# Patient Record
Sex: Male | Born: 1985 | Race: Black or African American | Hispanic: No | Marital: Single | Smoking: Current some day smoker
Health system: Southern US, Community
[De-identification: ages and names within clinical notes are randomized; demographics above are authoritative.]

## PROBLEM LIST (undated history)

## (undated) DIAGNOSIS — M542 Cervicalgia: Secondary | ICD-10-CM

## (undated) DIAGNOSIS — M5412 Radiculopathy, cervical region: Secondary | ICD-10-CM

## (undated) DIAGNOSIS — K469 Unspecified abdominal hernia without obstruction or gangrene: Secondary | ICD-10-CM

## (undated) DIAGNOSIS — Z8739 Personal history of other diseases of the musculoskeletal system and connective tissue: Secondary | ICD-10-CM

## (undated) DIAGNOSIS — G8929 Other chronic pain: Secondary | ICD-10-CM

## (undated) HISTORY — PX: HERNIA REPAIR: SHX51

## (undated) HISTORY — PX: KNEE SURGERY: SHX244

---
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
## 2012-03-22 ENCOUNTER — Emergency Department (HOSPITAL_COMMUNITY)
Admission: EM | Admit: 2012-03-22 | Discharge: 2012-03-22 | Disposition: A | Discharge status: Home / Self Care | Payer: Self-pay | Attending: Emergency Medicine | Admitting: Emergency Medicine

## 2012-03-22 ENCOUNTER — Encounter (HOSPITAL_COMMUNITY): Payer: Self-pay | Admitting: Emergency Medicine

## 2012-03-22 DIAGNOSIS — K458 Other specified abdominal hernia without obstruction or gangrene: Secondary | ICD-10-CM | POA: Insufficient documentation

## 2012-03-22 DIAGNOSIS — K4090 Unilateral inguinal hernia, without obstruction or gangrene, not specified as recurrent: Secondary | ICD-10-CM

## 2012-03-22 DIAGNOSIS — F17200 Nicotine dependence, unspecified, uncomplicated: Secondary | ICD-10-CM | POA: Insufficient documentation

## 2012-03-22 NOTE — ED Notes (Signed)
Pt c/o pain in LLQ of abd and groin for several weeks, pt states "I think I have a hernia". Pt states he does a lot of lifting at work.

## 2012-03-22 NOTE — ED Provider Notes (Signed)
History     CSN: [id number]  Arrival date & time 03/22/12  0904   First MD Initiated Contact with Patient 03/22/12 [phone]      Chief Complaint  Patient presents with  . Groin Pain    (Consider location/radiation/quality/duration/timing/severity/associated sxs/prior treatment) HPI Comments: Alex Vasquez is a 26 y.o. Male who has noticed swelling in his left groin for several weeks. The problem is more prominent when standing or lifting. He has mild associated pain. No history of vomiting, diarrhea, fever, chills. No similar in the past. The other modifying factors  The history is provided by the patient.    History reviewed. No pertinent past medical history.  Past Surgical History  Procedure Date  . Knee surgery     History reviewed. No pertinent family history.  History  Substance Use Topics  . Smoking status: Current Every Day Smoker  . Smokeless tobacco: Not on file  . Alcohol Use: Yes      Review of Systems  All other systems reviewed and are negative.    Allergies  Review of patient's allergies indicates no known allergies.  Home Medications   Current Outpatient Rx  Name  Route  Sig  Dispense  Refill  . IBUPROFEN 200 MG PO TABS   Oral   Take 400 mg by mouth every 6 (six) hours as needed. Pain           BP 147/92  Pulse 90  Temp 97.6 F (36.4 C) (Oral)  Resp 16  SpO2 98%  Physical Exam  Nursing note and vitals reviewed. Constitutional: He is oriented to person, place, and time. He appears well-developed and well-nourished.  HENT:  Head: Normocephalic and atraumatic.  Right Ear: External ear normal.  Left Ear: External ear normal.  Eyes: Conjunctivae normal and EOM are normal. Pupils are equal, round, and reactive to light.  Neck: Normal range of motion and phonation normal. Neck supple.  Cardiovascular: Normal rate.   Pulmonary/Chest: Effort normal. He exhibits no bony tenderness.  Abdominal: Soft. Normal appearance. There is no  tenderness.       Left groin hernia, 3 cm palpated passed through the scrotum. There is a bulge with Valsalva, and immediately reduces.  Musculoskeletal: Normal range of motion.  Neurological: He is alert and oriented to person, place, and time. He has normal strength. No cranial nerve deficit or sensory deficit. He exhibits normal muscle tone. Coordination normal.  Skin: Skin is warm, dry and intact.  Psychiatric: He has a normal mood and affect. His behavior is normal. Judgment and thought content normal.    ED Course  Procedures (including critical care time)      1. Left groin hernia       MDM  Left groin hernia, reducible. Doubt incarceration or obstruction. He stable for discharge with outpatient management.   Plan: Home Medications- Tylenol; Home Treatments- avoid lifting; Recommended follow up- Gen. Surg. prn     Flint Melter, MD 03/22/12 1501

## 2012-03-27 ENCOUNTER — Ambulatory Visit (INDEPENDENT_AMBULATORY_CARE_PROVIDER_SITE_OTHER): Payer: Self-pay | Admitting: General Surgery

## 2012-04-03 ENCOUNTER — Ambulatory Visit (INDEPENDENT_AMBULATORY_CARE_PROVIDER_SITE_OTHER): Payer: Self-pay | Admitting: General Surgery

## 2012-04-13 ENCOUNTER — Encounter (HOSPITAL_COMMUNITY): Payer: Self-pay | Admitting: Emergency Medicine

## 2012-04-13 ENCOUNTER — Emergency Department (HOSPITAL_COMMUNITY)
Admission: EM | Admit: 2012-04-13 | Discharge: 2012-04-13 | Disposition: A | Discharge status: Home / Self Care | Payer: Self-pay | Attending: Emergency Medicine | Admitting: Emergency Medicine

## 2012-04-13 DIAGNOSIS — K469 Unspecified abdominal hernia without obstruction or gangrene: Secondary | ICD-10-CM | POA: Insufficient documentation

## 2012-04-13 DIAGNOSIS — F17200 Nicotine dependence, unspecified, uncomplicated: Secondary | ICD-10-CM | POA: Insufficient documentation

## 2012-04-13 HISTORY — DX: Unspecified abdominal hernia without obstruction or gangrene: K46.9

## 2012-04-13 NOTE — ED Notes (Signed)
Pt presenting to ed with c/o I was seen here before Christmas for a hernia pt states he received a work note at that time but he hasn't been back to work and "I would like to see if I can get an extension" pt states he hasn't followed up with the surgery doctor.

## 2012-04-13 NOTE — ED Provider Notes (Signed)
History     CSN: [id number]  Arrival date & time 04/13/12  1050   First MD Initiated Contact with Patient 04/13/12 1109      Chief Complaint  Patient presents with  . work note    (Consider location/radiation/quality/duration/timing/severity/associated sxs/prior treatment) HPI The patient presents to the emergency department with a request for a work note to return to work.  Patient states he is here on December 19 and was diagnosed with a hernia and would like to go back to work.  Patient states that he called the surgeon about an appointment, but does not have the money to pay for it at this time.  Patient denies fever, nausea, vomiting, abdominal pain, back pain, headache, weakness, or diarrhea.  Patient states that he has not not had any other competitions from this hernia.    t Past Medical History  Diagnosis Date  . Hernia     Past Surgical History  Procedure Date  . Knee surgery     No family history on file.  History  Substance Use Topics  . Smoking status: Current Some Day Smoker  . Smokeless tobacco: Not on file  . Alcohol Use: Yes     Comment: rarely      Review of Systems All other systems negative except as documented in the HPI. All pertinent positives and negatives as reviewed in the HPI. Allergies  Review of patient's allergies indicates no known allergies.  Home Medications   Current Outpatient Rx  Name  Route  Sig  Dispense  Refill  . IBUPROFEN 200 MG PO TABS   Oral   Take 400 mg by mouth every 6 (six) hours as needed. Pain           BP 138/93  Pulse 91  Temp 98.3 F (36.8 C) (Oral)  Resp 20  SpO2 98%  Physical Exam  Nursing note and vitals reviewed. Constitutional: He is oriented to person, place, and time. He appears well-developed and well-nourished. No distress.  Eyes: Pupils are equal, round, and reactive to light.  Pulmonary/Chest: Effort normal.  Abdominal: Soft. Bowel sounds are normal. He exhibits no distension. There  is no tenderness.  Neurological: He is alert and oriented to person, place, and time.    ED Course  Procedures (including critical care time)  We will give the patient.  A note to return to work and is advised, that he'll need to see the surgeon, as soon as possible.  Return here for any worsening in his condition MDM         Carlyle Dolly, PA-C 04/13/12 1126

## 2012-04-13 NOTE — ED Provider Notes (Signed)
Medical screening examination/treatment/procedure(s) were performed by non-physician practitioner and as supervising physician I was immediately available for consultation/collaboration.   Alferd Obryant, MD 04/13/12 1220 

## 2012-04-13 NOTE — ED Notes (Signed)
Pt reports he has hernia pain that he was seen for last month. Sts he was given a work note stating that he could return to work and lift less than 20lbs and is requesting "an extension" on this in order to return to work until his insurance comes through to help pay for his hernia surgery. Pt reports he is still having pain but main concern is a work note.

## 2012-06-04 ENCOUNTER — Emergency Department (HOSPITAL_COMMUNITY): Payer: Self-pay

## 2012-06-04 ENCOUNTER — Emergency Department (HOSPITAL_COMMUNITY)
Admission: EM | Admit: 2012-06-04 | Discharge: 2012-06-04 | Disposition: A | Discharge status: Home / Self Care | Payer: Self-pay | Attending: Emergency Medicine | Admitting: Emergency Medicine

## 2012-06-04 ENCOUNTER — Encounter (HOSPITAL_COMMUNITY): Payer: Self-pay | Admitting: Emergency Medicine

## 2012-06-04 DIAGNOSIS — F17200 Nicotine dependence, unspecified, uncomplicated: Secondary | ICD-10-CM | POA: Insufficient documentation

## 2012-06-04 DIAGNOSIS — K5900 Constipation, unspecified: Secondary | ICD-10-CM | POA: Insufficient documentation

## 2012-06-04 DIAGNOSIS — R300 Dysuria: Secondary | ICD-10-CM | POA: Insufficient documentation

## 2012-06-04 DIAGNOSIS — Z8719 Personal history of other diseases of the digestive system: Secondary | ICD-10-CM | POA: Insufficient documentation

## 2012-06-04 DIAGNOSIS — R110 Nausea: Secondary | ICD-10-CM | POA: Insufficient documentation

## 2012-06-04 DIAGNOSIS — R42 Dizziness and giddiness: Secondary | ICD-10-CM | POA: Insufficient documentation

## 2012-06-04 DIAGNOSIS — K4090 Unilateral inguinal hernia, without obstruction or gangrene, not specified as recurrent: Secondary | ICD-10-CM | POA: Insufficient documentation

## 2012-06-04 LAB — URINALYSIS, ROUTINE W REFLEX MICROSCOPIC
Glucose, UA: NEGATIVE mg/dL
Hgb urine dipstick: NEGATIVE
Ketones, ur: NEGATIVE mg/dL
Leukocytes, UA: NEGATIVE
Protein, ur: NEGATIVE mg/dL
pH: 7 (ref 5.0–8.0)

## 2012-06-04 MED ORDER — LIDOCAINE HCL (CARDIAC) 20 MG/ML IV SOLN
INTRAVENOUS | Status: AC
  Filled 2012-06-04: qty 5

## 2012-06-04 MED ORDER — ETOMIDATE 2 MG/ML IV SOLN
INTRAVENOUS | Status: AC
  Filled 2012-06-04: qty 20

## 2012-06-04 MED ORDER — SUCCINYLCHOLINE CHLORIDE 20 MG/ML IJ SOLN
INTRAMUSCULAR | Status: AC
  Filled 2012-06-04: qty 1

## 2012-06-04 MED ORDER — ONDANSETRON 4 MG PO TBDP
8.0000 mg | ORAL_TABLET | Freq: Once | ORAL | Status: AC
  Administered 2012-06-04: 8 mg via ORAL
  Filled 2012-06-04: qty 2

## 2012-06-04 MED ORDER — ROCURONIUM BROMIDE 50 MG/5ML IV SOLN
INTRAVENOUS | Status: AC
  Filled 2012-06-04: qty 2

## 2012-06-04 MED ORDER — IBUPROFEN 600 MG PO TABS: 600.0000 mg | ORAL_TABLET | Freq: Four times a day (QID) | ORAL | Status: DC | PRN

## 2012-06-04 MED ORDER — IBUPROFEN 400 MG PO TABS
600.0000 mg | ORAL_TABLET | Freq: Once | ORAL | Status: AC
  Administered 2012-06-04: 600 mg via ORAL
  Filled 2012-06-04: qty 1

## 2012-06-04 MED ORDER — PROMETHAZINE HCL 25 MG RE SUPP: 25.0000 mg | Freq: Four times a day (QID) | RECTAL | Status: DC | PRN

## 2012-06-04 MED ORDER — HYDROCODONE-ACETAMINOPHEN 5-325 MG PO TABS: 1.0000 | ORAL_TABLET | Freq: Four times a day (QID) | ORAL | Status: DC | PRN

## 2012-06-04 MED ORDER — HYDROCODONE-ACETAMINOPHEN 5-325 MG PO TABS
1.0000 | ORAL_TABLET | Freq: Once | ORAL | Status: AC
  Administered 2012-06-04: 1 via ORAL
  Filled 2012-06-04: qty 1

## 2012-06-04 NOTE — ED Notes (Signed)
Onset 1 week ago dysuria and urinary frequency with constipation one day ago. Abdominal pain LLQ and RLQ with nausea pain 8/10 achy cramping.

## 2012-06-04 NOTE — ED Provider Notes (Signed)
History     CSN: [id number]  Arrival date & time 06/04/12  4098   First MD Initiated Contact with Patient 06/04/12 [phone]      Chief Complaint  Patient presents with  . Dysuria  . Abdominal Pain    (Consider location/radiation/quality/duration/timing/severity/associated sxs/prior treatment) HPI Comments: Pt comes in with cc of inguinal hernia pain. Pt has known inguinal hernia, left sided. Over the past few days, his pain has progressed slightly, and is more constant. There is some nausea, no emesis. Pt also feels constipated, last BM was yday. He is taking no pain meds at this time, and there is no fevers, chills. Pt has not seen a surgeon yet, as his insurance status is pending. Pt also feels that he is having slight discomfort occasionally with urination.  Patient is a 27 y.o. male presenting with dysuria and abdominal pain. The history is provided by the patient.  Dysuria  Associated symptoms include nausea. Pertinent negatives include no chills, no vomiting and no flank pain.  Abdominal Pain Associated symptoms: constipation and nausea   Associated symptoms: no chest pain, no chills, no cough, no fever, no shortness of breath and no vomiting     Past Medical History  Diagnosis Date  . Hernia     Past Surgical History  Procedure Laterality Date  . Knee surgery      No family history on file.  History  Substance Use Topics  . Smoking status: Current Some Day Smoker  . Smokeless tobacco: Not on file  . Alcohol Use: Yes     Comment: rarely      Review of Systems  Constitutional: Negative for fever, chills and activity change.  HENT: Negative for neck pain.   Eyes: Negative for visual disturbance.  Respiratory: Negative for cough, chest tightness and shortness of breath.   Cardiovascular: Negative for chest pain.  Gastrointestinal: Positive for nausea, abdominal pain and constipation. Negative for vomiting and abdominal distention.  Genitourinary: Negative for  flank pain, scrotal swelling, enuresis, difficulty urinating and testicular pain.  Musculoskeletal: Negative for arthralgias.  Neurological: Positive for dizziness. Negative for light-headedness and headaches.  Psychiatric/Behavioral: Negative for confusion.    Allergies  Review of patient's allergies indicates no known allergies.  Home Medications   Current Outpatient Rx  Name  Route  Sig  Dispense  Refill  . HYDROcodone-acetaminophen (NORCO/VICODIN) 5-325 MG per tablet   Oral   Take 1 tablet by mouth every 6 (six) hours as needed for pain.   15 tablet   0   . ibuprofen (ADVIL,MOTRIN) 600 MG tablet   Oral   Take 1 tablet (600 mg total) by mouth every 6 (six) hours as needed for pain.   30 tablet   0   . promethazine (PHENERGAN) 25 MG suppository   Rectal   Place 1 suppository (25 mg total) rectally every 6 (six) hours as needed for nausea.   12 each   0     BP 146/93  Pulse 80  Temp(Src) 97.5 F (36.4 C) (Oral)  Resp 16  Ht 6' (1.829 m)  Wt 245 lb (111.131 kg)  BMI 33.22 kg/m2  SpO2 100%  Physical Exam  Nursing note and vitals reviewed. Constitutional: He is oriented to person, place, and time. He appears well-developed.  HENT:  Head: Normocephalic and atraumatic.  Eyes: Conjunctivae and EOM are normal. Pupils are equal, round, and reactive to light.  Neck: Normal range of motion. Neck supple.  Cardiovascular: Normal rate and regular rhythm.  Pulmonary/Chest: Effort normal and breath sounds normal.  Abdominal: Soft. Bowel sounds are normal. He exhibits no distension. There is no tenderness. There is no rebound and no guarding.  Genitourinary: Penis normal.  Left sided inguinal hernia - able to reduce easily. Inguinal hernia verified via GU exam as well. No skin changes over lying the hernia.  Neurological: He is alert and oriented to person, place, and time.  Skin: Skin is warm.    ED Course  Procedures (including critical care time)  Labs Reviewed   URINALYSIS, ROUTINE W REFLEX MICROSCOPIC   Dg Abd Acute W/chest  06/04/2012  *RADIOLOGY REPORT*  Clinical Data: Abdominal pain with constipation.  Rule out bowel obstruction  ACUTE ABDOMEN SERIES (ABDOMEN 2 VIEW & CHEST 1 VIEW)  Comparison: None.  Findings: Lungs are clear without infiltrate or effusion.  Heart size is normal.  Mild amount of stool in the right and transverse colon.  No dilated bowel loops and no free air.  No acute bony abnormality.  No kidney stones.  IMPRESSION: No acute cardiopulmonary abnormality.  Retained stool in the colon without bowel obstruction.   Original Report Authenticated By: Janeece Riggers, M.D.      1. Inguinal hernia       MDM  Pt comes in cc og inguinal pain. No incarcerated hernia per exam. AAS shows no obstruction either. Will give pain meds. [facility] follow up, and we discussed need to return to the ED if the symptoms gets worse.   Derwood Kaplan, MD 06/04/12 1158

## 2012-06-08 ENCOUNTER — Ambulatory Visit (INDEPENDENT_AMBULATORY_CARE_PROVIDER_SITE_OTHER): Payer: Self-pay | Admitting: Surgery

## 2012-06-11 ENCOUNTER — Ambulatory Visit (INDEPENDENT_AMBULATORY_CARE_PROVIDER_SITE_OTHER): Payer: Self-pay | Admitting: Surgery

## 2012-06-11 ENCOUNTER — Encounter (INDEPENDENT_AMBULATORY_CARE_PROVIDER_SITE_OTHER): Payer: Self-pay | Admitting: Surgery

## 2012-06-11 VITALS — BP 128/86 | HR 76 | Temp 97.2°F | Resp 16 | Ht 72.0 in | Wt 251.0 lb

## 2012-06-11 DIAGNOSIS — K4090 Unilateral inguinal hernia, without obstruction or gangrene, not specified as recurrent: Secondary | ICD-10-CM

## 2012-06-11 NOTE — Patient Instructions (Signed)
Hernia Repair Care After These instructions give you information on caring for yourself after your procedure. Your doctor may also give you more specific instructions. Call your doctor if you have any problems or questions after your procedure. HOME CARE   You may have changes in your poops (bowel movements).  You may have loose or watery poop (diarrhea).  You may be not able to poop.  Your bowels will slowly get back to normal.  Do not eat any food that makes you sick to your stomach (nauseous). Eat small meals 4 to 6 times a day instead of 3 large ones.  Do not drink pop. It will give you gas.  Do not drink alcohol.  Do not lift anything heavier than 10 pounds. This is about the weight of a gallon of milk.  Do not do anything that makes you very tired for at least 6 weeks.  Do not get your wound wet for 2 days.  You may take a sponge bath during this time.  After 2 days you may take a shower. Gently pat your surgical cut (incision) dry with a towel. Do not rub it.  For men: You may have been given an athletic supporter (scrotal support) before you left the hospital. It holds your scrotum and testicles closer to your body so there is no strain on your wound. Wear the supporter until your doctor tells you that you do not need it anymore. GET HELP RIGHT AWAY IF:  You have watery poop, or cannot poop for more than 3 days.  You feel sick to your stomach or throw up (vomit) more than 2 or 3 times.  You have temperature by mouth above 102 F (38.9 C).  You see redness or puffiness (swelling) around your wound.  You see yellowish white fluid (pus) coming from your wound.  You see a bulge or bump in your lower belly (abdomen) or near your groin.  You develop a rash, trouble breathing, or any other symptoms from medicines taken. MAKE SURE YOU:  Understand these instructions.  Will watch your condition.  Will get help right away if your are not doing well or get  worse. Document Released: 03/03/2008 Document Revised: 06/13/2011 Document Reviewed: 03/03/2008 Exit[employer] Patient Information 2013 [employer].  

## 2012-06-11 NOTE — Progress Notes (Signed)
Patient ID: Alex Vasquez, male   DOB: 15-May-1985, 27 y.o.   MRN: [id number]  Chief Complaint  Patient presents with  . New Evaluation    eval [facility]    HPI Alex Vasquez is a 27 y.o. male.  Patient sent at the request of the emergency room for left inguinal hernia. It has been present since October 2013. Causing mild to moderate discomfort in the left groin made worse with lifting and exertion. Pain is mild to moderate sharp in nature when it occurs. No change in bowel or bladder function. HPI  Past Medical History  Diagnosis Date  . Hernia     Past Surgical History  Procedure Laterality Date  . Knee surgery      Family History  Problem Relation Age of Onset  . Cancer Paternal Aunt     unknown to pt    Social History History  Substance Use Topics  . Smoking status: Current Some Day Smoker  . Smokeless tobacco: Never Used  . Alcohol Use: Yes     Comment: rarely    No Known Allergies  Current Outpatient Prescriptions  Medication Sig Dispense Refill  . HYDROcodone-acetaminophen (NORCO/VICODIN) 5-325 MG per tablet Take 1 tablet by mouth every 6 (six) hours as needed for pain.  15 tablet  0  . ibuprofen (ADVIL,MOTRIN) 600 MG tablet Take 1 tablet (600 mg total) by mouth every 6 (six) hours as needed for pain.  30 tablet  0  . promethazine (PHENERGAN) 25 MG suppository Place 1 suppository (25 mg total) rectally every 6 (six) hours as needed for nausea.  12 each  0   No current facility-administered medications for this visit.    Review of Systems Review of Systems  Constitutional: Negative for fever, chills and unexpected weight change.  HENT: Negative for hearing loss, congestion, sore throat, trouble swallowing and voice change.   Eyes: Negative for visual disturbance.  Respiratory: Positive for cough. Negative for wheezing.   Cardiovascular: Negative for chest pain, palpitations and leg swelling.  Gastrointestinal: Negative for nausea, vomiting, abdominal pain,  diarrhea, constipation, blood in stool, abdominal distention, anal bleeding and rectal pain.  Genitourinary: Negative for hematuria and difficulty urinating.  Musculoskeletal: Negative for arthralgias.  Skin: Negative for rash and wound.  Neurological: Negative for seizures, syncope, weakness and headaches.  Hematological: Negative for adenopathy. Does not bruise/bleed easily.  Psychiatric/Behavioral: Negative for confusion.    Blood pressure 128/86, pulse 76, temperature 97.2 F (36.2 C), temperature source Temporal, resp. rate 16, height 6' (1.829 m), weight 251 lb (113.853 kg).  Physical Exam Physical Exam  Constitutional: He is oriented to person, place, and time. He appears well-developed and well-nourished.  HENT:  Head: Normocephalic and atraumatic.  Eyes: EOM are normal. Pupils are equal, round, and reactive to light.  Neck: Normal range of motion. Neck supple.  Cardiovascular: Normal rate and regular rhythm.   Pulmonary/Chest: Effort normal.  Abdominal: There is generalized tenderness. A hernia is present. Hernia confirmed positive in the left inguinal area. Hernia confirmed negative in the right inguinal area.  Musculoskeletal: Normal range of motion.  Neurological: He is alert and oriented to person, place, and time.  Skin: Skin is warm and dry.  Psychiatric: He has a normal mood and affect. His behavior is normal. Judgment and thought content normal.    Data Reviewed ED notes  Assessment    Left inguinal hernia symptomatic reducible    Plan    Recommend repair of symptomatic left inguinal hernia.The  risk of hernia repair include bleeding,  Infection,   Recurrence of the hernia,  Mesh use, chronic pain,  Organ injury,  Bowel injury,  Bladder injury,   nerve injury with numbness around the incision,  Death,  and worsening of preexisting  medical problems.  The alternatives to surgery have been discussed as well..  Long term expectations of both operative and non  operative treatments have been discussed.   The patient agrees to proceed.        CORNETT,THOMAS A. 06/11/2012, 3:09 PM

## 2012-10-07 ENCOUNTER — Emergency Department (HOSPITAL_COMMUNITY)
Admission: EM | Admit: 2012-10-07 | Discharge: 2012-10-08 | Disposition: A | Discharge status: Home / Self Care | Payer: Medicaid Other | Attending: Emergency Medicine | Admitting: Emergency Medicine

## 2012-10-07 ENCOUNTER — Encounter (HOSPITAL_COMMUNITY): Payer: Self-pay | Admitting: Emergency Medicine

## 2012-10-07 DIAGNOSIS — K4090 Unilateral inguinal hernia, without obstruction or gangrene, not specified as recurrent: Secondary | ICD-10-CM | POA: Insufficient documentation

## 2012-10-07 DIAGNOSIS — F17200 Nicotine dependence, unspecified, uncomplicated: Secondary | ICD-10-CM | POA: Insufficient documentation

## 2012-10-07 NOTE — ED Notes (Signed)
Pt states he has a history of inguinal hernia and when he was walking today it popped out and he and his girlfriend tried to reduce it but they were unsuccessful,  Pain 8/10,  Pt is alert and oriented in NAD

## 2012-10-07 NOTE — ED Notes (Signed)
Patient states that he was walking 2 days ago and felt his left groin hernia pop out. The patient reports increased pain to the area

## 2012-10-08 MED ORDER — OXYCODONE-ACETAMINOPHEN 5-325 MG PO TABS: ORAL_TABLET | ORAL | Status: DC

## 2012-10-08 MED ORDER — OXYCODONE-ACETAMINOPHEN 5-325 MG PO TABS
2.0000 | ORAL_TABLET | Freq: Once | ORAL | Status: AC
  Administered 2012-10-08: 2 via ORAL
  Filled 2012-10-08: qty 2

## 2012-10-08 NOTE — ED Provider Notes (Signed)
History    CSN: [id number] Arrival date & time 10/07/12  2249  First MD Initiated Contact with Patient 10/07/12 2308     Chief Complaint  Patient presents with  . Groin Pain   (Consider location/radiation/quality/duration/timing/severity/associated sxs/prior Treatment) HPI  Alex Vasquez is a 27 y.o. male complaining of left groin pain, patient has been diagnosed with left inguinal hernia, he has not followed up with surgery because he is not ensured. The patient had a very long walk many miles 2 days ago and since that time the hernia has her worse. He says the pain is severe, 10 out of 10, described as sharp. He denies fever, nausea vomiting, change in bowel habits. He has been taking over-the-counter pain medication such as Motrin and acetaminophen with little relief.  Past Medical History  Diagnosis Date  . Hernia    Past Surgical History  Procedure Laterality Date  . Knee surgery     Family History  Problem Relation Age of Onset  . Cancer Paternal Aunt     unknown to pt   History  Substance Use Topics  . Smoking status: Current Some Day Smoker  . Smokeless tobacco: Never Used  . Alcohol Use: Yes     Comment: rarely    Review of Systems  Constitutional:       Negative except as described in HPI  HENT:       Negative except as described in HPI  Respiratory:       Negative except as described in HPI  Cardiovascular:       Negative except as described in HPI  Gastrointestinal:       Negative except as described in HPI  Genitourinary:       Negative except as described in HPI  Musculoskeletal:       Negative except as described in HPI  Skin:       Negative except as described in HPI  Neurological:       Negative except as described in HPI  All other systems reviewed and are negative.    Allergies  Review of patient's allergies indicates no known allergies.  Home Medications  No current outpatient prescriptions on file. BP 144/93  Pulse 85   Temp(Src) 98.5 F (36.9 C) (Oral)  Resp 20  Ht 5\' 11"  (1.803 m)  Wt 245 lb (111.131 kg)  BMI 34.19 kg/m2  SpO2 97% Physical Exam  Nursing note and vitals reviewed. Constitutional: He is oriented to person, place, and time. He appears well-developed and well-nourished. No distress.  HENT:  Head: Normocephalic.  Eyes: Conjunctivae and EOM are normal.  Cardiovascular: Normal rate.   Pulmonary/Chest: Effort normal and breath sounds normal. No stridor.  Abdominal: Soft. Bowel sounds are normal. He exhibits mass. He exhibits no distension. There is no tenderness. There is no rebound and no guarding.  Left-sided inguinal hernia, reducible, very mildly tender to palpation.  Musculoskeletal: Normal range of motion.  Neurological: He is alert and oriented to person, place, and time.  Psychiatric: He has a normal mood and affect.    ED Course  Procedures (including critical care time) Labs Reviewed - No data to display No results found. 1. Inguinal hernia, left     MDM   Filed Vitals:   10/07/12 2257  BP: 144/93  Pulse: 85  Temp: 98.5 F (36.9 C)  TempSrc: Oral  Resp: 20  Height: 5\' 11"  (1.803 m)  Weight: 245 lb (111.131 kg)  SpO2: 97%  Alex Vasquez is a 27 y.o. male with reducible left inguinal hernia, patient is afebrile, abdominal exam is benign, no change in bowel habits. I have advised him of the red flags for which to return to the emergency room I have advised him he really does need to follow with surgery for elective hernia repair.  Medications  oxyCODONE-acetaminophen (PERCOCET/ROXICET) 5-325 MG per tablet 2 tablet (2 tablets Oral Given 10/08/12 0032)    Pt is hemodynamically stable, appropriate for, and amenable to discharge at this time. Pt verbalized understanding and agrees with care plan. Outpatient follow-up and specific return precautions discussed.    Discharge Medication List as of 10/08/2012 12:19 AM    START taking these medications   Details   oxyCODONE-acetaminophen (PERCOCET/ROXICET) 5-325 MG per tablet 1 to 2 tabs PO q6hrs  PRN for pain, Print         Wynetta Emery, PA-C 10/09/12 1033

## 2012-10-12 NOTE — ED Provider Notes (Signed)
Medical screening examination/treatment/procedure(s) were performed by non-physician practitioner and as supervising physician I was immediately available for consultation/collaboration.   David H Yao, MD 10/12/12 1518 

## 2013-12-24 ENCOUNTER — Emergency Department (HOSPITAL_COMMUNITY)
Admission: EM | Admit: 2013-12-24 | Discharge: 2013-12-24 | Disposition: A | Discharge status: Home / Self Care | Payer: Medicaid Other | Attending: Emergency Medicine | Admitting: Emergency Medicine

## 2013-12-24 ENCOUNTER — Encounter (HOSPITAL_COMMUNITY): Payer: Self-pay | Admitting: Emergency Medicine

## 2013-12-24 DIAGNOSIS — F17200 Nicotine dependence, unspecified, uncomplicated: Secondary | ICD-10-CM | POA: Insufficient documentation

## 2013-12-24 DIAGNOSIS — K4090 Unilateral inguinal hernia, without obstruction or gangrene, not specified as recurrent: Secondary | ICD-10-CM | POA: Insufficient documentation

## 2013-12-24 NOTE — Discharge Instructions (Signed)

## 2013-12-24 NOTE — ED Notes (Signed)
Per patient states left abdominal hernia for over 2 years-wants to know how bad it is and if he needs surgery

## 2013-12-24 NOTE — Progress Notes (Signed)
[facility] Community Liaison Stacy,  ° °Provided pt with a list of self-pay providers and a [facility] [employer] application to help patient establish primary care.  °

## 2013-12-24 NOTE — ED Provider Notes (Signed)
CSN: [id number]     Arrival date & time 12/24/13  1221 History   First MD Initiated Contact with Patient 12/24/13 1356     Chief Complaint  Patient presents with  . Hernia     HPI Patient states left inguinal hernia the past 2 years and is not sure what to do about it.  He denies nausea or vomiting.  Denies abdominal pain.  He states it is easily reducible when he lays on his back and persisted then.  Patient without other complaints.  Patient is currently without surgery   Past Medical History  Diagnosis Date  . Hernia    Past Surgical History  Procedure Laterality Date  . Knee surgery     Family History  Problem Relation Age of Onset  . Cancer Paternal Aunt     unknown to pt   History  Substance Use Topics  . Smoking status: Current Some Day Smoker  . Smokeless tobacco: Never Used  . Alcohol Use: Yes     Comment: rarely    Review of Systems  All other systems reviewed and are negative.     Allergies  Review of patient's allergies indicates no known allergies.  Home Medications   Prior to Admission medications   Not on File   BP 152/97  Pulse 74  Temp(Src) 98.2 F (36.8 C) (Oral)  Resp 18  SpO2 97% Physical Exam  Nursing note and vitals reviewed. Constitutional: He is oriented to person, place, and time. He appears well-developed and well-nourished.  HENT:  Head: Normocephalic.  Eyes: EOM are normal.  Neck: Normal range of motion.  Pulmonary/Chest: Effort normal.  Abdominal: He exhibits no distension.  Genitourinary:  Easily reducible left inguinal hernia  Musculoskeletal: Normal range of motion.  Neurological: He is alert and oriented to person, place, and time.  Psychiatric: He has a normal mood and affect.    ED Course  Procedures (including critical care time) Labs Review Labs Reviewed - No data to display  Imaging Review No results found.   EKG Interpretation None      MDM   Final diagnoses:  Left inguinal hernia    General  surgery followup.  Patient given an extensive discussion and information on the portable healthcare act and his ability to gain insurance for the coming year starting November 1.    Lyanne Co, MD 12/24/13 [phone]

## 2016-05-04 ENCOUNTER — Encounter (HOSPITAL_COMMUNITY): Payer: Self-pay

## 2016-05-04 ENCOUNTER — Emergency Department (HOSPITAL_COMMUNITY)
Admission: EM | Admit: 2016-05-04 | Discharge: 2016-05-04 | Disposition: A | Discharge status: Home / Self Care | Payer: Medicaid Other | Attending: Emergency Medicine | Admitting: Emergency Medicine

## 2016-05-04 ENCOUNTER — Emergency Department (HOSPITAL_COMMUNITY): Payer: Medicaid Other

## 2016-05-04 DIAGNOSIS — Y990 Civilian activity done for income or pay: Secondary | ICD-10-CM | POA: Insufficient documentation

## 2016-05-04 DIAGNOSIS — S46011A Strain of muscle(s) and tendon(s) of the rotator cuff of right shoulder, initial encounter: Secondary | ICD-10-CM | POA: Insufficient documentation

## 2016-05-04 DIAGNOSIS — F17290 Nicotine dependence, other tobacco product, uncomplicated: Secondary | ICD-10-CM | POA: Insufficient documentation

## 2016-05-04 DIAGNOSIS — Y929 Unspecified place or not applicable: Secondary | ICD-10-CM | POA: Insufficient documentation

## 2016-05-04 DIAGNOSIS — X500XXA Overexertion from strenuous movement or load, initial encounter: Secondary | ICD-10-CM | POA: Insufficient documentation

## 2016-05-04 DIAGNOSIS — Y939 Activity, unspecified: Secondary | ICD-10-CM | POA: Insufficient documentation

## 2016-05-04 DIAGNOSIS — S46911A Strain of unspecified muscle, fascia and tendon at shoulder and upper arm level, right arm, initial encounter: Secondary | ICD-10-CM

## 2016-05-04 MED ORDER — NAPROXEN 500 MG PO TABS: 500.0000 mg | ORAL_TABLET | Freq: Two times a day (BID) | ORAL | 0 refills | Status: DC

## 2016-05-04 MED ORDER — TRAMADOL HCL 50 MG PO TABS: 50.0000 mg | ORAL_TABLET | Freq: Four times a day (QID) | ORAL | 0 refills | Status: AC | PRN

## 2016-05-04 MED ORDER — METHOCARBAMOL 500 MG PO TABS: 500.0000 mg | ORAL_TABLET | Freq: Two times a day (BID) | ORAL | 0 refills | Status: DC

## 2016-05-04 NOTE — ED Notes (Signed)
Patient transported to X-ray 

## 2016-05-04 NOTE — Discharge Instructions (Signed)
Ice and rest your shoulder. No lifting greater than 5 lb with right arm for at least 1-2 weeks. Take naprosyn for pain and inflammation. Robaxin for muscle spasms. Tramadol for severe pain. Follow up with orthopedics specialist if not improving.

## 2016-05-04 NOTE — ED Provider Notes (Signed)
[facility]-EMERGENCY DEPT Provider Note   CSN: [id number] Arrival date & time: 05/04/16  1541  By signing my name below, I, Teofilo PodMatthew P. Jamison, attest that this documentation has been prepared under the direction and in the presence of [place], PA-C. Electronically Signed: Teofilo PodMatthew P. Jamison, ED Scribe. 05/04/2016. 5:03 PM.   History   Chief Complaint Chief Complaint  Patient presents with  . Shoulder Pain  . Back Pain    The history is provided by the patient. No language interpreter was used.   HPI Comments:  Alex Vasquez is a 31 y.o. male who presents to the Emergency Department complaining of constant upper back and bilateral shoulder pain x 6 days. Pt reports doing heavy lifting at work and states that he was picking up a pallet and felt a sharp pain radiate from his right shoulder across to his left shoulder. He states that the pain is most severe on the right side. Pt states that the pain is exacerbated with moving his left arm, and states that he has to sleep on his left side. Pt complains of associated neck pain on the right side, and mild tingling in his right hand. No alleviating factors noted. Pt denies other associated symptoms.   Past Medical History:  Diagnosis Date  . Hernia     There are no active problems to display for this patient.   Past Surgical History:  Procedure Laterality Date  . HERNIA REPAIR    . KNEE SURGERY         Home Medications    Prior to Admission medications   Not on File    Family History Family History  Problem Relation Age of Onset  . Cancer Paternal Aunt     unknown to pt    Social History Social History  Substance Use Topics  . Smoking status: Current Some Day Smoker    Types: Cigars  . Smokeless tobacco: Never Used  . Alcohol use Yes     Comment: rarely     Allergies   Patient has no known allergies.   Review of Systems Review of Systems  Musculoskeletal: Positive for back pain, myalgias and neck  pain.  Neurological: Positive for numbness.     Physical Exam Updated Vital Signs BP (!) 159/104 (BP Location: Right Arm)   Pulse 98   Temp 98.4 F (36.9 C) (Oral)   Resp 16   Ht 6' (1.829 m)   Wt 250 lb (113.4 kg)   SpO2 98%   BMI 33.91 kg/m   Physical Exam  Constitutional: He appears well-developed and well-nourished. No distress.  HENT:  Head: Normocephalic and atraumatic.  Eyes: Conjunctivae are normal.  Neck: Neck supple.  Cardiovascular: Normal rate.   Pulmonary/Chest: Effort normal.  Abdominal: He exhibits no distension.  Musculoskeletal:  Diffuse tenderness to palpation over right shoulder joint including over anterior posterior joints. Full range of motion of the shoulder joint passively. Pain with raising arm past 90 actively. Pain with internal and external rotation of the shoulder joint. Positive right arm drop test. No tenderness over the elbow or wrist. Normal cervical spine.  Neurological: He is alert.  Biceps, triceps, deltoid strength intact. Grip strength 5 out of 5 and equal bilaterally. Distal radial pulses intact bilaterally.  Skin: Skin is warm and dry.  Psychiatric: He has a normal mood and affect.  Nursing note and vitals reviewed.    ED Treatments / Results  DIAGNOSTIC STUDIES:  Oxygen Saturation is 98% on RA, normal  by my interpretation.    COORDINATION OF CARE:  5:03 PM Discussed treatment plan with pt at bedside and pt agreed to plan.    Labs (all labs ordered are listed, but only abnormal results are displayed) Labs Reviewed - No data to display  EKG  EKG Interpretation None       Radiology Dg Shoulder Right  Result Date: 05/04/2016 CLINICAL DATA:  RIGHT shoulder pain for 3 weeks, does heavy lifting at work, was picking up a pallet and felt a sharp pain from RIGHT shoulder EXAM: RIGHT SHOULDER - 2+ VIEW COMPARISON:  None. FINDINGS: Osseous mineralization normal. AC joint alignment normal. No acute fracture, dislocation or bone  destruction. Visualized ribs unremarkable. IMPRESSION: Normal exam. Electronically Signed   By: Ulyses Southward M.D.   On: 05/04/2016 17:35    Procedures Procedures (including critical care time)  Medications Ordered in ED Medications - No data to display   Initial Impression / Assessment and Plan / ED Course  I have reviewed the triage vital signs and the nursing notes.  Pertinent labs & imaging results that were available during my care of the patient were reviewed by me and considered in my medical decision making (see chart for details).    Patient emergency department with right shoulder injury, presents with continued pain. Neurovascularly intact. X-ray negative. I suspect possible rotator cuff injury from my exam. Instructed to rest his arm, will provide a work note, home with naproxen, tramadol, Robaxin. Follow-up with orthopedic specialist if not improving. Final Clinical Impressions(s) / ED Diagnoses   Final diagnoses:  Strain of right shoulder, initial encounter    New Prescriptions Discharge Medication List as of 05/04/2016  5:40 PM    START taking these medications   Details  methocarbamol (ROBAXIN) 500 MG tablet Take 1 tablet (500 mg total) by mouth 2 (two) times daily., Starting Wed 05/04/2016, Print    naproxen (NAPROSYN) 500 MG tablet Take 1 tablet (500 mg total) by mouth 2 (two) times daily., Starting Wed 05/04/2016, Print    traMADol (ULTRAM) 50 MG tablet Take 1 tablet (50 mg total) by mouth every 6 (six) hours as needed., Starting Wed 05/04/2016, Print       I personally performed the services described in this documentation, which was scribed in my presence. The recorded information has been reviewed and is accurate.    Jaynie Crumble, PA-C 05/04/16 1953    Lavera Guise, MD 05/05/16 2032

## 2016-05-04 NOTE — ED Triage Notes (Signed)
Patient c/o upper back pain and bilateral shoulder pain x 6 days. 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

## 2016-05-11 ENCOUNTER — Encounter (HOSPITAL_COMMUNITY): Payer: Self-pay | Admitting: Emergency Medicine

## 2016-05-11 ENCOUNTER — Emergency Department (HOSPITAL_COMMUNITY)
Admission: EM | Admit: 2016-05-11 | Discharge: 2016-05-11 | Disposition: A | Discharge status: Home / Self Care | Payer: Medicaid Other | Attending: Emergency Medicine | Admitting: Emergency Medicine

## 2016-05-11 DIAGNOSIS — Y999 Unspecified external cause status: Secondary | ICD-10-CM | POA: Insufficient documentation

## 2016-05-11 DIAGNOSIS — F17290 Nicotine dependence, other tobacco product, uncomplicated: Secondary | ICD-10-CM | POA: Insufficient documentation

## 2016-05-11 DIAGNOSIS — Y929 Unspecified place or not applicable: Secondary | ICD-10-CM | POA: Insufficient documentation

## 2016-05-11 DIAGNOSIS — Z79899 Other long term (current) drug therapy: Secondary | ICD-10-CM | POA: Insufficient documentation

## 2016-05-11 DIAGNOSIS — S161XXA Strain of muscle, fascia and tendon at neck level, initial encounter: Secondary | ICD-10-CM | POA: Insufficient documentation

## 2016-05-11 DIAGNOSIS — Y939 Activity, unspecified: Secondary | ICD-10-CM | POA: Insufficient documentation

## 2016-05-11 DIAGNOSIS — X58XXXA Exposure to other specified factors, initial encounter: Secondary | ICD-10-CM | POA: Insufficient documentation

## 2016-05-11 MED ORDER — METHYLPREDNISOLONE 4 MG PO TBPK: ORAL_TABLET | ORAL | 0 refills | Status: AC

## 2016-05-11 MED ORDER — DEXAMETHASONE SODIUM PHOSPHATE 10 MG/ML IJ SOLN
10.0000 mg | Freq: Once | INTRAMUSCULAR | Status: AC
  Administered 2016-05-11: 10 mg via INTRAMUSCULAR
  Filled 2016-05-11: qty 1

## 2016-05-11 NOTE — Discharge Instructions (Signed)
Please read and follow all provided instructions.  Your diagnoses today include:  1. Strain of neck muscle, initial encounter    Tests performed today include: Vital signs. See below for your results today.   Medications prescribed:  Take as prescribed   Home care instructions:  Follow any educational materials contained in this packet.  Follow-up instructions: Please follow-up with your primary care provider for further evaluation of symptoms and treatment   Return instructions:  Please return to the Emergency Department if you do not get better, if you get worse, or new symptoms OR  - Fever (temperature greater than 101.47F)  - Bleeding that does not stop with holding pressure to the area    -Severe pain (please note that you may be more sore the day after your accident)  - Chest Pain  - Difficulty breathing  - Severe nausea or vomiting  - Inability to tolerate food and liquids  - Passing out  - Skin becoming red around your wounds  - Change in mental status (confusion or lethargy)  - New numbness or weakness    Please return if you have any other emergent concerns.  Additional Information:  Your vital signs today were: BP (!) 160/105 (BP Location: Left Arm)    Pulse 91    Temp 98.8 F (37.1 C) (Oral)    Resp 16    SpO2 97%  If your blood pressure (BP) was elevated above 135/85 this visit, please have this repeated by your doctor within one month. ---------------

## 2016-05-11 NOTE — ED Triage Notes (Signed)
Pt c/o bilateral arm pain; pt states both shoulders hurt and pain radiates to tip of middle finger; also states he has numbness in fingers and some in his forearms; full range of motion present

## 2016-05-11 NOTE — ED Provider Notes (Signed)
[facility]-EMERGENCY DEPT Provider Note   CSN: [id number] Arrival date & time: 05/11/16  5621     History   Chief Complaint No chief complaint on file.   HPI Alex Vasquez is a 31 y.o. male.  HPI  31 y.o. male, presents to the Emergency Department today complaining of bilateral arm pain/shoulder pain. Seen on 05-04-16 for same. Unremarkable work up at that time. Pt states taking antiinflammatories, muscle relaxants have not helped. Rates pain 5/10 and worse on right side. States numbness from neck to distal tip of middle finger. No decrease in grip strength. No loss of sensation. No N/V. No fevers. No CP/SOB/ABD pain. No other symptoms noted.    Past Medical History:  Diagnosis Date  . Hernia     There are no active problems to display for this patient.   Past Surgical History:  Procedure Laterality Date  . HERNIA REPAIR    . KNEE SURGERY       Home Medications    Prior to Admission medications   Medication Sig Start Date End Date Taking? Authorizing Provider  methocarbamol (ROBAXIN) 500 MG tablet Take 1 tablet (500 mg total) by mouth 2 (two) times daily. 05/04/16  Yes Tatyana Kirichenko, PA-C  naproxen (NAPROSYN) 500 MG tablet Take 1 tablet (500 mg total) by mouth 2 (two) times daily. 05/04/16  Yes Tatyana Kirichenko, PA-C  traMADol (ULTRAM) 50 MG tablet Take 1 tablet (50 mg total) by mouth every 6 (six) hours as needed. Patient taking differently: Take 50 mg by mouth every 6 (six) hours as needed for moderate pain.  05/04/16  Yes Jaynie Crumble, PA-C    Family History Family History  Problem Relation Age of Onset  . Cancer Paternal Aunt     unknown to pt    Social History Social History  Substance Use Topics  . Smoking status: Current Some Day Smoker    Types: Cigars  . Smokeless tobacco: Never Used  . Alcohol use Yes     Comment: rarely     Allergies   Patient has no known allergies.   Review of Systems Review of Systems  Constitutional: Negative for  fever.  Respiratory: Negative for shortness of breath.   Cardiovascular: Negative for chest pain.  Gastrointestinal: Negative for nausea and vomiting.  Musculoskeletal: Positive for arthralgias. Negative for myalgias.  Allergic/Immunologic: Negative for immunocompromised state.   Physical Exam Updated Vital Signs BP (!) 160/105 (BP Location: Left Arm)   Pulse 91   Temp 98.8 F (37.1 C) (Oral)   Resp 16   SpO2 97%   Physical Exam  Constitutional: He is oriented to person, place, and time. Vital signs are normal. He appears well-developed and well-nourished.  HENT:  Head: Normocephalic and atraumatic.  Right Ear: Hearing normal.  Left Ear: Hearing normal.  Eyes: Conjunctivae and EOM are normal. Pupils are equal, round, and reactive to light.  Neck: Trachea normal, normal range of motion and full passive range of motion without pain. Neck supple. No spinous process tenderness and no muscular tenderness present. No neck rigidity. No edema, no erythema and normal range of motion present.  TTP right sided musculature of cervical spine. No palpable or visible deformities.   Cardiovascular: Normal rate, regular rhythm, normal heart sounds and intact distal pulses.   Pulmonary/Chest: Effort normal and breath sounds normal.  Musculoskeletal: Normal range of motion.  Neurological: He is alert and oriented to person, place, and time. He has normal strength. No sensory deficit.  BUE Grip Strengths  Equal. Motor/sensation intact.  Skin: Skin is warm and dry.  Psychiatric: He has a normal mood and affect. His speech is normal and behavior is normal. Thought content normal.  Nursing note and vitals reviewed.  ED Treatments / Results  Labs (all labs ordered are listed, but only abnormal results are displayed) Labs Reviewed - No data to display  EKG  EKG Interpretation None       Radiology No results found.  Procedures Procedures (including critical care time)  Medications Ordered in  ED Medications - No data to display   Initial Impression / Assessment and Plan / ED Course  I have reviewed the triage vital signs and the nursing notes.  Pertinent labs & imaging results that were available during my care of the patient were reviewed by me and considered in my medical decision making (see chart for details).    Final Clinical Impressions(s) / ED Diagnoses     {I have reviewed the relevant previous healthcare records.  {I obtained HPI from historian.   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 Mild. Noted lifting injury x 3 weeks prior. Seen in ED for same. On exam, pt in NAD. Nontoxic/nonseptic appearing. VSS. Afebrile. Lungs CTA. Heart RRR. Grip strengths equal bilaterally. No decrease in motor/sensation. TTP on right cervical spine musculature Given steroids in ED. Plan is to DC home with medrol and follow up to PCP. At time of discharge, Patient is in no acute distress. Vital Signs are stable. Patient is able to ambulate. Patient able to tolerate PO.   Disposition/Plan:  DC Home Additional Verbal discharge instructions given and discussed with patient.  Pt Instructed to f/u with PCP in the next week for evaluation and treatment of symptoms. Return precautions given Pt acknowledges and agrees with plan  Supervising Physician Nira ConnPedro Eduardo Cardama, MD  Final diagnoses:  Strain of neck muscle, initial encounter    New Prescriptions New Prescriptions   No medications on file     Audry Piliyler Kalah Pflum, PA-C 05/11/16 [id number]    Nira ConnPedro Eduardo Cardama, MD 05/11/16 [phone]

## 2016-08-10 ENCOUNTER — Emergency Department (HOSPITAL_COMMUNITY)
Admission: EM | Admit: 2016-08-10 | Discharge: 2016-08-11 | Disposition: A | Discharge status: Home / Self Care | Payer: Medicaid Other | Attending: Emergency Medicine | Admitting: Emergency Medicine

## 2016-08-10 ENCOUNTER — Encounter (HOSPITAL_COMMUNITY): Payer: Self-pay | Admitting: Emergency Medicine

## 2016-08-10 DIAGNOSIS — X500XXA Overexertion from strenuous movement or load, initial encounter: Secondary | ICD-10-CM | POA: Insufficient documentation

## 2016-08-10 DIAGNOSIS — Y929 Unspecified place or not applicable: Secondary | ICD-10-CM | POA: Insufficient documentation

## 2016-08-10 DIAGNOSIS — Y990 Civilian activity done for income or pay: Secondary | ICD-10-CM | POA: Insufficient documentation

## 2016-08-10 DIAGNOSIS — M542 Cervicalgia: Secondary | ICD-10-CM | POA: Insufficient documentation

## 2016-08-10 DIAGNOSIS — F17290 Nicotine dependence, other tobacco product, uncomplicated: Secondary | ICD-10-CM | POA: Insufficient documentation

## 2016-08-10 DIAGNOSIS — Y9389 Activity, other specified: Secondary | ICD-10-CM | POA: Insufficient documentation

## 2016-08-10 HISTORY — DX: Personal history of other diseases of the musculoskeletal system and connective tissue: Z87.39

## 2016-08-10 MED ORDER — IBUPROFEN 200 MG PO TABS
600.0000 mg | ORAL_TABLET | Freq: Once | ORAL | Status: AC
  Administered 2016-08-11: 600 mg via ORAL
  Filled 2016-08-10: qty 3

## 2016-08-10 MED ORDER — PREDNISONE 20 MG PO TABS: 40.0000 mg | ORAL_TABLET | Freq: Every day | ORAL | 0 refills | Status: DC

## 2016-08-10 NOTE — Discharge Instructions (Signed)
Take your medication as prescribed. You may also apply ice to affected area for 15-20 minutes 3-4 times daily as needed for additional relief. I recommend refrain from doing any strenuous activity or repetitive movements that exacerbate her symptoms for the next few days. Follow-up with a primary care provider listed below within the next week for follow-up evaluation and further management of your chronic neck pain. Return to the emergency department if symptoms worsen or new onset of fever, neck stiffness, redness, swelling, chest pain, difficulty breathing, weakness, numbness.

## 2016-08-10 NOTE — ED Provider Notes (Signed)
[facility]-EMERGENCY DEPT Provider Note   CSN: [id number] Arrival date & time: 08/10/16  2233     History   Chief Complaint No chief complaint on file.   HPI Alex Vasquez is a 31 y.o. male.  HPI   Patient is a 31 year old male with no pertinent past medical history presents the ED with complaint of right-sided neck pain. Patient reports having intermittent neck pain for the past 5 months. He notes the pain will intermittently radiate down into part of his right arm with associated tingling. Patient reports his pain today feels consistent with the pain he has been having over the past few months which she was previously evaluated in the ED for twice however he notes the pain is becoming more frequent. He states he continues to do lots of heavy lifting with his upper body at his job which he thinks is contributing to his worsening symptoms. Denies any recent fall, trauma or injury. Denies taking any medications at home for his symptoms. Patient denies following up with a PCP since his prior ED visit for similar complaint. Denies fever, neck stiffness, headache, chest pain, shortness of breath, abdominal pain, vomiting, numbness, weakness, back pain, decreased range of motion.   Past Medical History:  Diagnosis Date  . Hernia   . History of herniated intervertebral disc     There are no active problems to display for this patient.   Past Surgical History:  Procedure Laterality Date  . HERNIA REPAIR    . KNEE SURGERY         Home Medications    Prior to Admission medications   Medication Sig Start Date End Date Taking? Authorizing Provider  methocarbamol (ROBAXIN) 500 MG tablet Take 1 tablet (500 mg total) by mouth 2 (two) times daily. 05/04/16   Kirichenko, Lemont Fillersatyana, PA-C  methylPREDNISolone (MEDROL DOSEPAK) 4 MG TBPK tablet Take as written 05/11/16   Audry PiliMohr, Tyler, PA-C  naproxen (NAPROSYN) 500 MG tablet Take 1 tablet (500 mg total) by mouth 2 (two) times daily. 05/04/16   Kirichenko,  Tatyana, PA-C  predniSONE (DELTASONE) 20 MG tablet Take 2 tablets (40 mg total) by mouth daily. 08/10/16   Barrett HenleNadeau, Nicole Elizabeth, PA-C  traMADol (ULTRAM) 50 MG tablet Take 1 tablet (50 mg total) by mouth every 6 (six) hours as needed. Patient taking differently: Take 50 mg by mouth every 6 (six) hours as needed for moderate pain.  05/04/16   Jaynie CrumbleKirichenko, Tatyana, PA-C    Family History Family History  Problem Relation Age of Onset  . Cancer Paternal Aunt     unknown to pt    Social History Social History  Substance Use Topics  . Smoking status: Current Some Day Smoker    Types: Cigars  . Smokeless tobacco: Never Used  . Alcohol use Yes     Comment: rarely     Allergies   Patient has no known allergies.   Review of Systems Review of Systems  Musculoskeletal: Positive for myalgias (right arm) and neck pain.  Neurological:       Tingling  All other systems reviewed and are negative.    Physical Exam Updated Vital Signs BP (!) 152/100 (BP Location: Left Arm)   Pulse 99   Temp 98.3 F (36.8 C) (Oral)   Resp 18   SpO2 100%   Physical Exam  Constitutional: He is oriented to person, place, and time. He appears well-developed and well-nourished.  HENT:  Head: Normocephalic and atraumatic.  Eyes: Conjunctivae and EOM are  normal. Right eye exhibits no discharge. Left eye exhibits no discharge. No scleral icterus.  Neck: Trachea normal, normal range of motion and full passive range of motion without pain. Neck supple. Muscular tenderness present. No spinous process tenderness present. No neck rigidity. No edema, no erythema and normal range of motion present.  Cardiovascular: Normal rate, regular rhythm, normal heart sounds and intact distal pulses.  Exam reveals no gallop and no friction rub.   No murmur heard. Pulmonary/Chest: Effort normal and breath sounds normal. No respiratory distress. He has no wheezes. He has no rales. He exhibits no tenderness.  Abdominal: Soft. He  exhibits no distension.  Musculoskeletal: Normal range of motion. He exhibits tenderness. He exhibits no edema or deformity.  No midline C, T, or L tenderness. Mild TTP over right cervical paraspinal muscles, right upper trapezius and right deltoid. Full range of motion of neck and back. Full range of motion of bilateral upper and lower extremities, with 5/5 strength. Sensation intact. 2+ radial and PT pulses. Cap refill <2 seconds.   Neurological: He is alert and oriented to person, place, and time.  Skin: Skin is warm and dry.  Nursing note and vitals reviewed.    ED Treatments / Results  Labs (all labs ordered are listed, but only abnormal results are displayed) Labs Reviewed - No data to display  EKG  EKG Interpretation None       Radiology No results found.  Procedures Procedures (including critical care time)  Medications Ordered in ED Medications  ibuprofen (ADVIL,MOTRIN) tablet 600 mg (not administered)     Initial Impression / Assessment and Plan / ED Course  I have reviewed the triage vital signs and the nursing notes.  Pertinent labs & imaging results that were available during my care of the patient were reviewed by me and considered in my medical decision making (see chart for details).     Patient presents with right-sided neck pain radiating into his right arm with associated intermittent tingling. Denies any recent fall, trauma or injury. Reports being seen in the ED previously for similar complaint and was diagnosed with cervical disc hernia. Denies final with PCP or neurologist since last ED visit. VSS. Exam revealed mild tenderness over right cervical spine musculature and right upper back muscles. No midline spinal tenderness. Bilateral upper extremities neurovascularly intact with 5 out of 5 strength. Remaining exam unremarkable. Suspect patient's symptoms are likely due to muscle strain/mild cervical disc herniation; no concerning neuro deficits on exam  warrating emergent imaging at this time. Plan to discharge patient home with anti-inflammatories and symptomatic treatment. Patient given information to follow up with PCP outpatient. Discussed return precautions.  Final Clinical Impressions(s) / ED Diagnoses   Final diagnoses:  Neck pain    New Prescriptions New Prescriptions   PREDNISONE (DELTASONE) 20 MG TABLET    Take 2 tablets (40 mg total) by mouth daily.     Barrett Henle, PA-C 08/10/16 2349    Lorre Nick, MD 08/11/16 [phone]

## 2016-08-10 NOTE — ED Triage Notes (Signed)
Pt from home with complaints of neck pain that began in March. Pt states that he has been having intermittent right arm numbness that has been ongoing for months. Pt states it has become more frequent and lasting longer. Pt denies injury but states he has hx of herniated disc. Pt has not seen a neurologist for this.

## 2017-01-11 ENCOUNTER — Emergency Department (HOSPITAL_COMMUNITY)
Admission: EM | Admit: 2017-01-11 | Discharge: 2017-01-11 | Disposition: A | Discharge status: Home / Self Care | Payer: Self-pay | Attending: Emergency Medicine | Admitting: Emergency Medicine

## 2017-01-11 ENCOUNTER — Encounter (HOSPITAL_COMMUNITY): Payer: Self-pay | Admitting: Emergency Medicine

## 2017-01-11 DIAGNOSIS — F17290 Nicotine dependence, other tobacco product, uncomplicated: Secondary | ICD-10-CM | POA: Insufficient documentation

## 2017-01-11 DIAGNOSIS — M5412 Radiculopathy, cervical region: Secondary | ICD-10-CM

## 2017-01-11 DIAGNOSIS — Z79899 Other long term (current) drug therapy: Secondary | ICD-10-CM | POA: Insufficient documentation

## 2017-01-11 DIAGNOSIS — G542 Cervical root disorders, not elsewhere classified: Secondary | ICD-10-CM | POA: Insufficient documentation

## 2017-01-11 MED ORDER — PREDNISONE 20 MG PO TABS
40.0000 mg | ORAL_TABLET | Freq: Every day | ORAL | 0 refills | Status: DC
Start: 2017-01-11 — End: 2017-07-26

## 2017-01-11 MED ORDER — IBUPROFEN 600 MG PO TABS: 600.0000 mg | ORAL_TABLET | Freq: Four times a day (QID) | ORAL | 0 refills | Status: AC | PRN

## 2017-01-11 NOTE — ED Provider Notes (Addendum)
[facility]-EMERGENCY DEPT Provider Note   CSN: [id number] Arrival date & time: 01/11/17  1018     History   Chief Complaint Chief Complaint  Patient presents with  . Neck Pain    HPI Alex Vasquez is a 31 y.o. male.  HPI Patient with history of degenerative neck disease, clinically diagnosed comes in with chief complaint of neck pain and right arm pain. Patient reports that for the last year he has had intermittent episodes of pain in his neck which radiates down towards his hands. He also has associated tingling sensation on the volar and palmar aspect of his upper extremity. Patient reports that over the past few weeks he has had increasing amount of episodes. 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 Patient denies any significant midline neck pain or new trauma. Patient doesn't think the pain is worse with shoulder movement.  Patient was clinically diagnosed with this condition in an emergency room. He has not seen outpatient doctors as requested.  Past Medical History:  Diagnosis Date  . Hernia   . History of herniated intervertebral disc     There are no active problems to display for this patient.   Past Surgical History:  Procedure Laterality Date  . HERNIA REPAIR    . KNEE SURGERY         Home Medications    Prior to Admission medications   Medication Sig Start Date End Date Taking? Authorizing Provider  ibuprofen (ADVIL,MOTRIN) 600 MG tablet Take 1 tablet (600 mg total) by mouth every 6 (six) hours as needed. 01/11/17   Derwood Kaplan, MD  methocarbamol (ROBAXIN) 500 MG tablet Take 1 tablet (500 mg total) by mouth 2 (two) times daily. 05/04/16   Kirichenko, Lemont Fillers, PA-C  methylPREDNISolone (MEDROL DOSEPAK) 4 MG TBPK tablet Take as written 05/11/16   Audry Pili, PA-C  naproxen (NAPROSYN) 500 MG tablet Take 1 tablet (500 mg total) by mouth 2 (two) times daily. 05/04/16   Kirichenko, Tatyana, PA-C  predniSONE (DELTASONE) 20 MG tablet Take 2 tablets (40 mg total)  by mouth daily. 01/11/17   Derwood Kaplan, MD  traMADol (ULTRAM) 50 MG tablet Take 1 tablet (50 mg total) by mouth every 6 (six) hours as needed. Patient taking differently: Take 50 mg by mouth every 6 (six) hours as needed for moderate pain.  05/04/16   Jaynie Crumble, PA-C    Family History Family History  Problem Relation Age of Onset  . Cancer Paternal Aunt        unknown to pt    Social History Social History  Substance Use Topics  . Smoking status: Current Some Day Smoker    Types: Cigars  . Smokeless tobacco: Never Used  . Alcohol use Yes     Comment: rarely     Allergies   Patient has no known allergies.   Review of Systems Review of Systems  Musculoskeletal: Positive for myalgias and neck pain.  Neurological: Positive for numbness.     Physical Exam Updated Vital Signs BP 136/90 (BP Location: Right Arm)   Pulse 89   Temp 97.6 F (36.4 C) (Oral)   Resp 18   Ht 6' (1.829 m)   Wt 102.7 kg (226 lb 8 oz)   SpO2 99%   BMI 30.72 kg/m   Physical Exam  Constitutional: He is oriented to person, place, and time. He appears well-developed.  HENT:  Head: Atraumatic.  Neck: Neck supple.  No midline c-spine tenderness, pt able to turn head  to 45 degrees bilaterally without any pain and able to flex neck to the chest and extend without any pain or neurologic symptoms.  Pt's tenderness not worse with movement to the R shoulder  Cardiovascular: Normal rate.   Pulmonary/Chest: Effort normal.  Neurological: He is alert and oriented to person, place, and time.  Skin: Skin is warm.  Nursing note and vitals reviewed.    ED Treatments / Results  Labs (all labs ordered are listed, but only abnormal results are displayed) Labs Reviewed - No data to display  EKG  EKG Interpretation None       Radiology No results found.  Procedures Procedures (including critical care time)  Medications Ordered in ED Medications - No data to display   Initial  Impression / Assessment and Plan / ED Course  I have reviewed the triage vital signs and the nursing notes.  Pertinent labs & imaging results that were available during my care of the patient were reviewed by me and considered in my medical decision making (see chart for details).     Pt comes in with cc of neck pain, radiating to the hand and associated tingling. Pt currently has no tingling or pain in her arm. ROM of shoulder not evoking pain. Seems like radicular pain. Will tx with nsaids and steroids. Advised ortho f/u.  Final Clinical Impressions(s) / ED Diagnoses   Final diagnoses:  Cervical nerve root impingement  Cervical radiculopathy    New Prescriptions Discharge Medication List as of 01/11/2017 11:48 AM    START taking these medications   Details  ibuprofen (ADVIL,MOTRIN) 600 MG tablet Take 1 tablet (600 mg total) by mouth every 6 (six) hours as needed., Starting Wed 01/11/2017, Print         Derwood Kaplan, MD 01/11/17 1248    Derwood Kaplan, MD 01/11/17 1248

## 2017-01-11 NOTE — Discharge Instructions (Signed)
See the spine surgeon. There is likely a nerve impingement at the level of the shoulder or neck, and it would be better to figure out the cause of your pain and ensure it doesn't get worse.

## 2017-01-11 NOTE — ED Triage Notes (Signed)
Pt c/o R chronic neck pain that has been radiating into shoulder, intermittent R hand numbness. Pt states he has not taken OTC or prescription meds. Pt states he is lifting heavy objects at work constantly.

## 2017-01-11 NOTE — ED Notes (Signed)
Bed: WTR8 Expected date:  Expected time:  Means of arrival:  Comments: 

## 2017-06-30 ENCOUNTER — Emergency Department (HOSPITAL_COMMUNITY): Admission: EM | Admit: 2017-06-30 | Discharge: 2017-07-01 | Discharge status: Against Medical Advice | Payer: Self-pay

## 2017-07-01 ENCOUNTER — Other Ambulatory Visit: Payer: Self-pay

## 2017-07-01 ENCOUNTER — Emergency Department (HOSPITAL_COMMUNITY): Payer: Self-pay

## 2017-07-01 ENCOUNTER — Emergency Department (HOSPITAL_COMMUNITY)
Admission: EM | Admit: 2017-07-01 | Discharge: 2017-07-01 | Disposition: A | Discharge status: Home / Self Care | Payer: Self-pay | Attending: Emergency Medicine | Admitting: Emergency Medicine

## 2017-07-01 ENCOUNTER — Encounter (HOSPITAL_COMMUNITY): Payer: Self-pay | Admitting: Emergency Medicine

## 2017-07-01 DIAGNOSIS — G8929 Other chronic pain: Secondary | ICD-10-CM | POA: Insufficient documentation

## 2017-07-01 DIAGNOSIS — F17290 Nicotine dependence, other tobacco product, uncomplicated: Secondary | ICD-10-CM | POA: Insufficient documentation

## 2017-07-01 DIAGNOSIS — Z79899 Other long term (current) drug therapy: Secondary | ICD-10-CM | POA: Insufficient documentation

## 2017-07-01 DIAGNOSIS — M542 Cervicalgia: Secondary | ICD-10-CM | POA: Insufficient documentation

## 2017-07-01 HISTORY — DX: Cervicalgia: M54.2

## 2017-07-01 HISTORY — DX: Other chronic pain: G89.29

## 2017-07-01 HISTORY — DX: Radiculopathy, cervical region: M54.12

## 2017-07-01 MED ORDER — METHOCARBAMOL 500 MG PO TABS: 1000.0000 mg | ORAL_TABLET | Freq: Four times a day (QID) | ORAL | 0 refills | Status: AC | PRN

## 2017-07-01 MED ORDER — NAPROXEN 250 MG PO TABS: 250.0000 mg | ORAL_TABLET | Freq: Two times a day (BID) | ORAL | 0 refills | Status: DC | PRN

## 2017-07-01 NOTE — ED Notes (Signed)
Called patient

## 2017-07-01 NOTE — ED Notes (Signed)
Patient called to be triage and no answer. 

## 2017-07-01 NOTE — ED Notes (Signed)
Called patient and no answer.

## 2017-07-01 NOTE — ED Notes (Signed)
Patient transported to CT 

## 2017-07-01 NOTE — ED Triage Notes (Signed)
Patient is complaining of right arm pain and numbness. Patient states he has herniated disk.

## 2017-07-01 NOTE — Discharge Instructions (Addendum)
Take the prescriptions as directed.  Apply moist heat or ice to the area(s) of discomfort, for 15 minutes at a time, several times per day for the next few days.  Do not fall asleep on a heating or ice pack.  Call your regular medical doctor on Monday to schedule a follow up appointment this week.  Return to the Emergency Department immediately if worsening. ° °

## 2017-07-01 NOTE — ED Provider Notes (Signed)
[facility]-EMERGENCY DEPT Provider Note   CSN: [id number] Arrival date & time: 07/01/17  9528     History   Chief Complaint Chief Complaint  Patient presents with  . Arm Pain    HPI Alex Vasquez is a 32 y.o. male.   Arm Pain    Pt was seen at 0725.  Per pt, c/o gradual onset and persistence of constant acute flair of his chronic right sided neck "pain" for the past several days. Has been associated with right arm "pain" and "numbness." Denies any change in his usual chronic symptoms pattern.  Pain worsens with palpation of the area and body position changes. States he has hx of "herniated disk in his neck." Denies incont/retention of bowel or bladder, no saddle anesthesia, no focal motor weakness, no fevers, no injury, no abd pain.   The symptoms have been associated with no other complaints. The patient has a significant history of similar symptoms previously, recently being evaluated for this complaint and multiple prior evals for same.    Past Medical History:  Diagnosis Date  . Cervical radiculopathy   . Chronic neck pain   . Hernia   . History of herniated intervertebral disc     There are no active problems to display for this patient.   Past Surgical History:  Procedure Laterality Date  . HERNIA REPAIR    . KNEE SURGERY          Home Medications    Prior to Admission medications   Medication Sig Start Date End Date Taking? Authorizing Provider  ibuprofen (ADVIL,MOTRIN) 600 MG tablet Take 1 tablet (600 mg total) by mouth every 6 (six) hours as needed. 01/11/17   Derwood Kaplan, MD  methocarbamol (ROBAXIN) 500 MG tablet Take 1 tablet (500 mg total) by mouth 2 (two) times daily. 05/04/16   Kirichenko, Lemont Fillers, PA-C  methylPREDNISolone (MEDROL DOSEPAK) 4 MG TBPK tablet Take as written 05/11/16   Audry Pili, PA-C  naproxen (NAPROSYN) 500 MG tablet Take 1 tablet (500 mg total) by mouth 2 (two) times daily. 05/04/16   Kirichenko, Tatyana,  PA-C  predniSONE (DELTASONE) 20 MG tablet Take 2 tablets (40 mg total) by mouth daily. 01/11/17   Derwood Kaplan, MD  traMADol (ULTRAM) 50 MG tablet Take 1 tablet (50 mg total) by mouth every 6 (six) hours as needed. Patient taking differently: Take 50 mg by mouth every 6 (six) hours as needed for moderate pain.  05/04/16   Jaynie Crumble, PA-C    Family History Family History  Problem Relation Age of Onset  . Cancer Paternal Aunt        unknown to pt    Social History Social History   Tobacco Use  . Smoking status: Current Some Day Smoker    Types: Cigars  . Smokeless tobacco: Never Used  Substance Use Topics  . Alcohol use: Yes    Comment: rarely  . Drug use: No     Allergies   Patient has no known allergies.   Review of Systems Review of Systems ROS: Statement: All systems negative except as marked or noted in the HPI; Constitutional: Negative for fever and chills. ; ; Eyes: Negative for eye pain, redness and discharge. ; ; ENMT: Negative for ear pain, hoarseness, nasal congestion, sinus pressure and sore throat. ; ; Cardiovascular: Negative for chest pain, palpitations, diaphoresis, dyspnea and peripheral edema. ; ; Respiratory: Negative for cough, wheezing and stridor. ; ; Gastrointestinal: Negative for nausea, vomiting, diarrhea, abdominal  pain, blood in stool, hematemesis, jaundice and rectal bleeding. . ; ; Genitourinary: Negative for dysuria, flank pain and hematuria. ; ; Musculoskeletal: +chronic neck pain. Negative for back pain. Negative for swelling and trauma.; ; Skin: Negative for pruritus, rash, abrasions, blisters, bruising and skin lesion.; ; Neuro: +paresthesias. Negative for headache, lightheadedness and neck stiffness. Negative for weakness, altered level of consciousness, altered mental status, extremity weakness, involuntary movement, seizure and syncope.       Physical Exam Updated Vital Signs BP (!) 149/96 (BP Location: Left Arm)   Pulse 95    Temp 98.1 F (36.7 C) (Oral)   Resp 14   Ht 6' (1.829 m)   Wt 99.2 kg (218 lb 12.8 oz)   SpO2 95%   BMI 29.67 kg/m   Physical Exam 0730: Physical examination:  Nursing notes reviewed; Vital signs and O2 SAT reviewed;  Constitutional: Well developed, Well nourished, Well hydrated, In no acute distress; Head:  Normocephalic, atraumatic; Eyes: EOMI, PERRL, No scleral icterus; ENMT: Mouth and pharynx normal, Mucous membranes moist; Neck: Supple, Full range of motion, No lymphadenopathy; Cardiovascular: Regular rate and rhythm, No gallop; Respiratory: Breath sounds clear & equal bilaterally, No wheezes.  Speaking full sentences with ease, Normal respiratory effort/excursion; Chest: Nontender, Movement normal; Abdomen: Soft, Nontender, Nondistended, Normal bowel sounds; Genitourinary: No CVA tenderness; Spine:  No midline CS, TS, LS tenderness. +TTP right cervical paraspinal muscles.;; Extremities: Peripheral pulses normal, NT right shoulder/elbow/wrist/hand. RUE muscles compartments soft, strong radial pulse. No deformity. No tenderness, No edema, No calf edema or asymmetry.; Neuro: AA&Ox3, Major CN grossly intact.  Speech clear. No gross focal motor or sensory deficits in extremities.; Skin: Color normal, Warm, Dry.   ED Treatments / Results  Labs (all labs ordered are listed, but only abnormal results are displayed)   EKG None  Radiology   Procedures Procedures (including critical care time)  Medications Ordered in ED Medications - No data to display   Initial Impression / Assessment and Plan / ED Course  I have reviewed the triage vital signs and the nursing notes.  Pertinent labs & imaging results that were available during my care of the patient were reviewed by me and considered in my medical decision making (see chart for details).  MDM Reviewed: nursing note, vitals and previous chart Reviewed previous: x-ray Interpretation: CT scan   Ct Cervical Spine Wo  Contrast Result Date: 07/01/2017 CLINICAL DATA:  Right arm pain and numbness for 3 months radiating to the fingers EXAM: CT CERVICAL SPINE WITHOUT CONTRAST TECHNIQUE: Multidetector CT imaging of the cervical spine was performed without intravenous contrast. Multiplanar CT image reconstructions were also generated. COMPARISON:  None. FINDINGS: Alignment: Straightening/mild reversal of cervical lordosis. No listhesis. Mild dextrocurvature which could be positional. Skull base and vertebrae: Negative for fracture, bone lesion, or erosion. Soft tissues and spinal canal: Low cerebellar tonsils with degree of foramen magnum crowding. Tonsillar descent is by 1 cm. No detected syrinx. Disc levels: No bony foraminal impingement to explain the history. No gross herniation. Upper chest: Negative IMPRESSION: 1. No acute finding or bony foraminal impingement. 2. 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 Electronically Signed   By: Marnee Spring M.D.   On: 07/01/2017 08:19    0910:  CT reassuring. Tx symptomatically at this time. Long hx of chronic pain with multiple ED visits for same.  Pt endorses acute flair of his usual long standing chronic pain today, no change from his usual chronic  pain pattern.  Pt encouraged to establish with a PMD and Pain Management doctor for good continuity of care and control of his chronic pain.  Pt verb understanding. Dx and testing d/w pt.  Questions answered.  Verb understanding, agreeable to d/c home with outpt f/u.       Final Clinical Impressions(s) / ED Diagnoses   Final diagnoses:  None    ED Discharge Orders    None       Samuel JesterMcManus, Stephannie Broner, DO 07/04/17 1235

## 2017-07-13 ENCOUNTER — Ambulatory Visit (INDEPENDENT_AMBULATORY_CARE_PROVIDER_SITE_OTHER): Payer: Self-pay | Admitting: Orthopedic Surgery

## 2017-07-25 ENCOUNTER — Encounter (HOSPITAL_COMMUNITY): Payer: Self-pay | Admitting: Emergency Medicine

## 2017-07-25 ENCOUNTER — Other Ambulatory Visit: Payer: Self-pay

## 2017-07-25 DIAGNOSIS — Y9389 Activity, other specified: Secondary | ICD-10-CM | POA: Insufficient documentation

## 2017-07-25 DIAGNOSIS — Y9289 Other specified places as the place of occurrence of the external cause: Secondary | ICD-10-CM | POA: Insufficient documentation

## 2017-07-25 DIAGNOSIS — Y990 Civilian activity done for income or pay: Secondary | ICD-10-CM | POA: Insufficient documentation

## 2017-07-25 DIAGNOSIS — M542 Cervicalgia: Secondary | ICD-10-CM | POA: Insufficient documentation

## 2017-07-25 DIAGNOSIS — X500XXA Overexertion from strenuous movement or load, initial encounter: Secondary | ICD-10-CM | POA: Insufficient documentation

## 2017-07-25 DIAGNOSIS — F17290 Nicotine dependence, other tobacco product, uncomplicated: Secondary | ICD-10-CM | POA: Insufficient documentation

## 2017-07-25 NOTE — ED Triage Notes (Signed)
Pt states he has pain in his neck and shoulder area for the past month  Pt states tonight the pain went from the left to the right side  Pt wants referral to chiropractor or someone

## 2017-07-26 ENCOUNTER — Emergency Department (HOSPITAL_COMMUNITY)
Admission: EM | Admit: 2017-07-26 | Discharge: 2017-07-26 | Disposition: A | Discharge status: Home / Self Care | Payer: Self-pay | Attending: Emergency Medicine | Admitting: Emergency Medicine

## 2017-07-26 DIAGNOSIS — M542 Cervicalgia: Secondary | ICD-10-CM

## 2017-07-26 MED ORDER — CYCLOBENZAPRINE HCL 10 MG PO TABS: 10.0000 mg | ORAL_TABLET | Freq: Two times a day (BID) | ORAL | 0 refills | Status: AC | PRN

## 2017-07-26 MED ORDER — PREDNISONE 20 MG PO TABS: ORAL_TABLET | ORAL | 0 refills | Status: AC

## 2017-07-26 NOTE — ED Provider Notes (Signed)
[facility]-EMERGENCY DEPT Provider Note   CSN: [id number] Arrival date & time: 07/25/17  2145     History   Chief Complaint Chief Complaint  Patient presents with  . Neck Pain    HPI Alex Vasquez is a 32 y.o. male.  The history is provided by the patient and medical records.  Neck Pain       32 year old male with history of chronic neck pain, cervical radiculopathy, inguinal hernia, presenting to the ED with neck pain.  Patient reports he has been dealing with this issue for well over a year now.  States he has chronic pain in the right side of his neck and right shoulder.  States tonight he was at work and picked up a heavy bag of potatoes and had sharp pain from the left side of his neck over to the right side of his neck.  He denies any new numbness or weakness of his arms.  He does have some chronic intermittent paresthesias of the right hand which seem worse if he lays on his right side.  States he does have a habit of falling asleep or leaning on his right side when watching TV.  He also occasionally sleeps with his right arm above his head.  States he is interested in seeing a chiropractor or other specialist.  It appears he has been referred several times but is never followed up.  States he has taken prednisone and muscle relaxers in the past with some relief.  Past Medical History:  Diagnosis Date  . Cervical radiculopathy   . Chronic neck pain   . Hernia   . History of herniated intervertebral disc     There are no active problems to display for this patient.   Past Surgical History:  Procedure Laterality Date  . HERNIA REPAIR    . KNEE SURGERY          Home Medications    Prior to Admission medications   Medication Sig Start Date End Date Taking? Authorizing Provider  cyclobenzaprine (FLEXERIL) 10 MG tablet Take 1 tablet (10 mg total) by mouth 2 (two) times daily as needed for muscle spasms. 07/26/17   Garlon Hatchet, PA-C    ibuprofen (ADVIL,MOTRIN) 200 MG tablet Take 400 mg by mouth every 6 (six) hours as needed for mild pain or moderate pain.    [provider]  ibuprofen (ADVIL,MOTRIN) 600 MG tablet Take 1 tablet (600 mg total) by mouth every 6 (six) hours as needed. Patient not taking: Reported on 07/01/2017 01/11/17   Derwood Kaplan, MD  methocarbamol (ROBAXIN) 500 MG tablet Take 2 tablets (1,000 mg total) by mouth 4 (four) times daily as needed for muscle spasms (muscle spasm/pain). 07/01/17   Samuel Jester, DO  methylPREDNISolone (MEDROL DOSEPAK) 4 MG TBPK tablet Take as written Patient not taking: Reported on 07/01/2017 05/11/16   Audry Pili, PA-C  naproxen (NAPROSYN) 250 MG tablet Take 1 tablet (250 mg total) by mouth 2 (two) times daily as needed for mild pain or moderate pain (take with food). 07/01/17   Samuel Jester, DO  predniSONE (DELTASONE) 20 MG tablet Take 40 mg by mouth daily for 3 days, then 20mg  by mouth daily for 3 days, then 10mg  daily for 3 days 07/26/17   Garlon Hatchet, PA-C  traMADol (ULTRAM) 50 MG tablet Take 1 tablet (50 mg total) by mouth every 6 (six) hours as needed. Patient not taking: Reported on 07/01/2017 05/04/16   Jaynie Crumble, PA-C  Family History Family History  Problem Relation Age of Onset  . Hypertension Father   . Cancer Paternal Aunt        unknown to pt  . Diabetes Paternal Aunt     Social History Social History   Tobacco Use  . Smoking status: Current Some Day Smoker    Types: Cigars  . Smokeless tobacco: Never Used  Substance Use Topics  . Alcohol use: Yes    Comment: rarely  . Drug use: No     Allergies   Patient has no known allergies.   Review of Systems Review of Systems  Musculoskeletal: Positive for neck pain.  All other systems reviewed and are negative.    Physical Exam Updated Vital Signs BP (!) 164/107 (BP Location: Right Arm)   Pulse 69   Temp 98.3 F (36.8 C) (Oral)   Resp 14   Ht 6' (1.829 m)   Wt 100  kg (220 lb 8 oz)   SpO2 100%   BMI 29.91 kg/m   Physical Exam  Constitutional: He is oriented to person, place, and time. He appears well-developed and well-nourished.  HENT:  Head: Normocephalic and atraumatic.  Mouth/Throat: Oropharynx is clear and moist.  Eyes: Pupils are equal, round, and reactive to light. Conjunctivae and EOM are normal.  Neck: Normal range of motion.  Cardiovascular: Normal rate, regular rhythm and normal heart sounds.  Pulmonary/Chest: Effort normal and breath sounds normal.  Abdominal: Soft. Bowel sounds are normal.  Musculoskeletal: Normal range of motion.       Back:  Some mild tenderness along cervical paraspinal muscles with extension towards right shoulder, there is no significant spasm, no midline tenderness, step-off, or deformity, full range of motion maintained; some pain elicited in the right neck when lifting right arm above the head.  Normal grip strength bilaterally, normal distal sensation and perfusion of both arms  Neurological: He is alert and oriented to person, place, and time.  Skin: Skin is warm and dry.  Psychiatric: He has a normal mood and affect.  Nursing note and vitals reviewed.    ED Treatments / Results  Labs (all labs ordered are listed, but only abnormal results are displayed) Labs Reviewed - No data to display  EKG None  Radiology No results found.  Procedures Procedures (including critical care time)  Medications Ordered in ED Medications - No data to display   Initial Impression / Assessment and Plan / ED Course  I have reviewed the triage vital signs and the nursing notes.  Pertinent labs & imaging results that were available during my care of the patient were reviewed by me and considered in my medical decision making (see chart for details).  32 year old male presenting to the ED with acute on chronic right-sided neck pain.  This is been ongoing for about a year now.   Reports acute on chronic pain tonight  after picking up bag of potatoes at work.  No acute deformities noted to the neck.  Continues to have some tenderness along right cervical paraspinal musculature.  No focal neurologic deficits suggestive of central cord syndrome.  Patient recently had CT cervical spine on 07/01/17 which did not show any acute pathology.  Do not feel he needs repeat imaging today.  Patient chronically lays on his right side which may be contributing to his symptoms.  Has gotten good relief with steroids and muscle relaxants in the past so will re-start this.  Will refer to orthopedics for follow-up.  Discussed plan  with patient, he acknowledged understanding and agreed with plan of care.  Return precautions given for new or worsening symptoms.  Final Clinical Impressions(s) / ED Diagnoses   Final diagnoses:  Neck pain    ED Discharge Orders        Ordered    predniSONE (DELTASONE) 20 MG tablet     07/26/17 0154    cyclobenzaprine (FLEXERIL) 10 MG tablet  2 times daily PRN     07/26/17 0154       Garlon HatchetSanders, Kerigan Narvaez M, PA-C 07/26/17 [id number]    Derwood KaplanNanavati, Ankit, MD 07/27/17 0045

## 2017-07-26 NOTE — Discharge Instructions (Signed)
Take the prescribed medication as directed. Follow-up with Dr. Ranell PatrickNorris-- call his office for appt. Return to the ED for new or worsening symptoms.

## 2019-02-21 ENCOUNTER — Other Ambulatory Visit: Payer: Self-pay

## 2019-02-21 DIAGNOSIS — Z20822 Contact with and (suspected) exposure to covid-19: Secondary | ICD-10-CM

## 2019-02-24 LAB — NOVEL CORONAVIRUS, NAA: SARS-CoV-2, NAA: NOT DETECTED

## 2019-03-20 ENCOUNTER — Other Ambulatory Visit: Payer: Self-pay

## 2019-11-21 IMAGING — CT CT CERVICAL SPINE W/O CM
3 of 4 series · 13 of 33 positions shown, 16 images · non-contrast
Comparison: None.

CLINICAL DATA: Right arm pain and numbness for 3 months radiating
to the fingers

EXAM:
CT CERVICAL SPINE WITHOUT CONTRAST
TECHNIQUE: Multidetector CT imaging of the cervical spine was performed without
intravenous contrast. Multiplanar CT image reconstructions were also
generated.

[Series 7: orthogonal bone · axial · 0.23mm/px · z∈[+1412,+1554]mm · 5 of 111 slices shown, 7 images]
[im 19/111  soft-tissue]
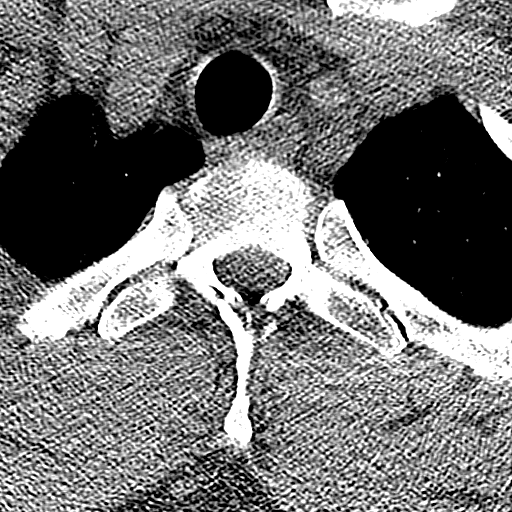
[im 19/111  bone]
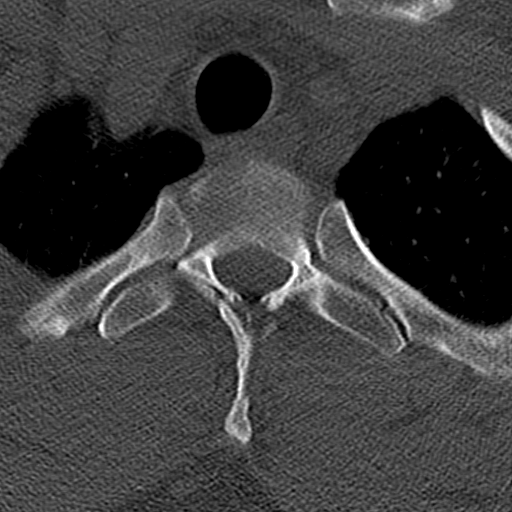
[im 37/111  bone]
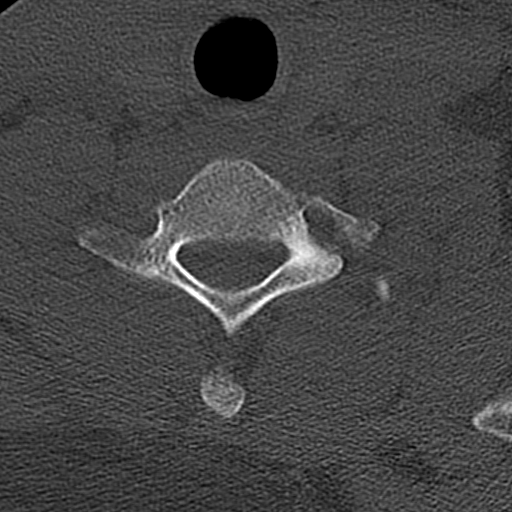
[im 56/111  bone]
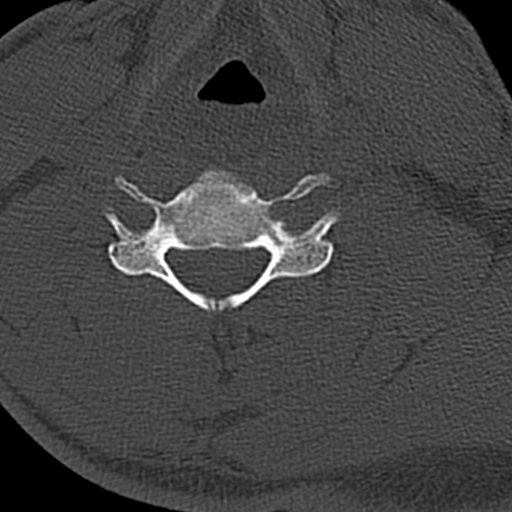
[im 74/111  bone]
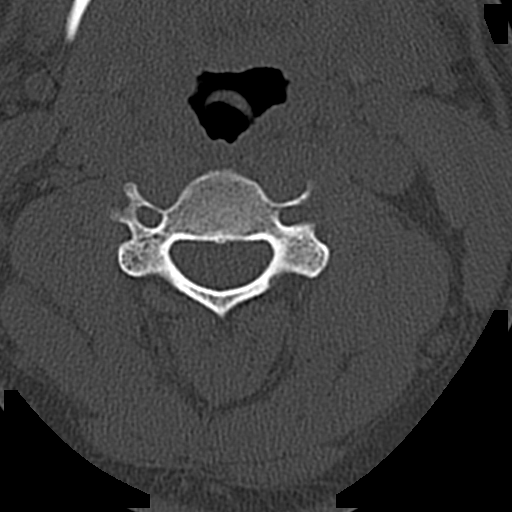
[im 92/111  soft-tissue]
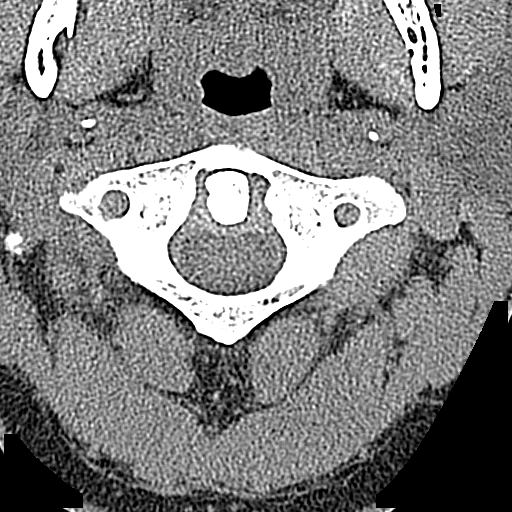
[im 92/111  bone]
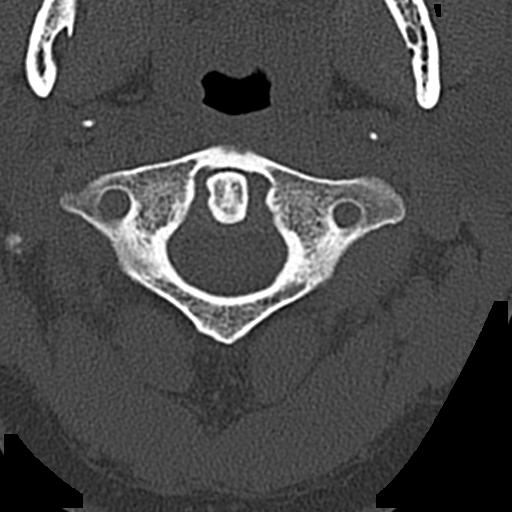

[Series 8: coronal bone · coronal · 0.30mm/px · 3 of 61 slices shown]
[im 13/61  bone]
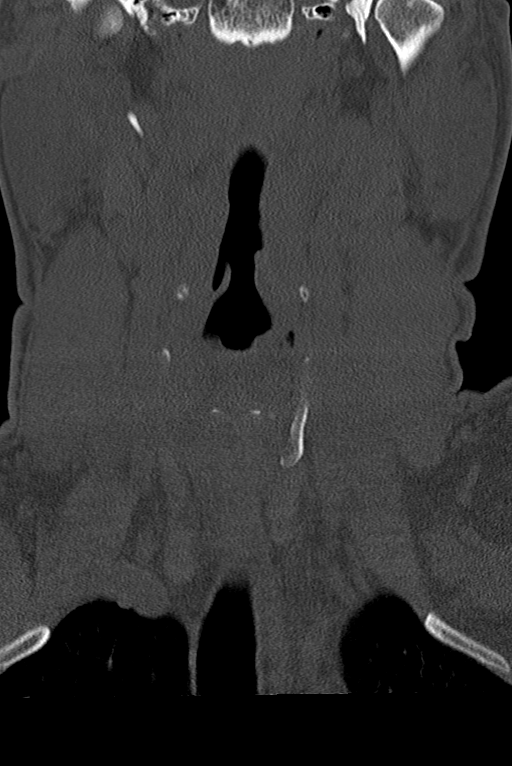
[im 25/61  bone]
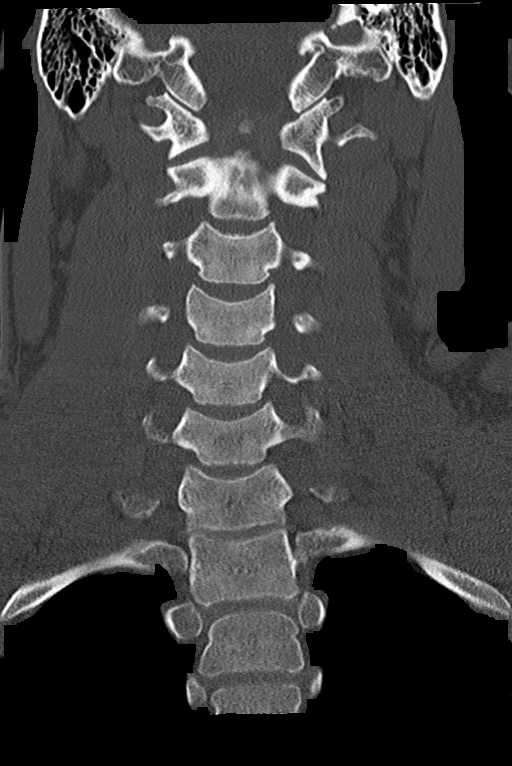
[im 37/61  bone]
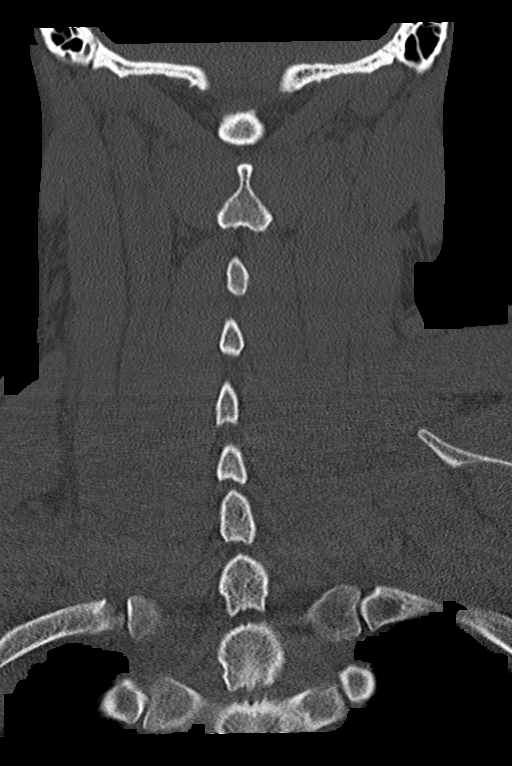

[Series 9: sagittal bone · sagittal · 0.30mm/px · 5 of 61 slices shown, 6 images]
[im 21/61  bone]
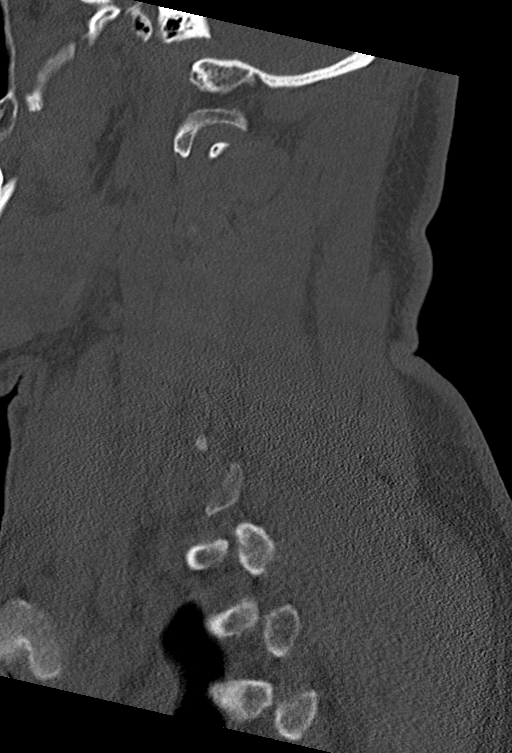
[im 26/61  bone]
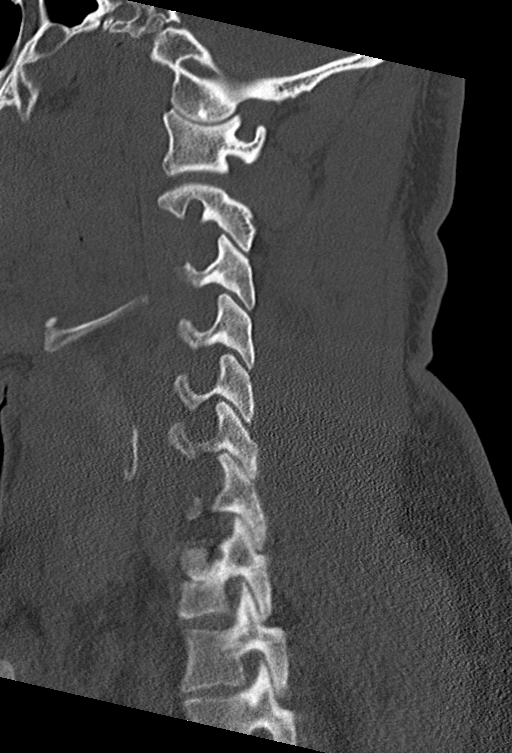
[im 31/61  soft-tissue]
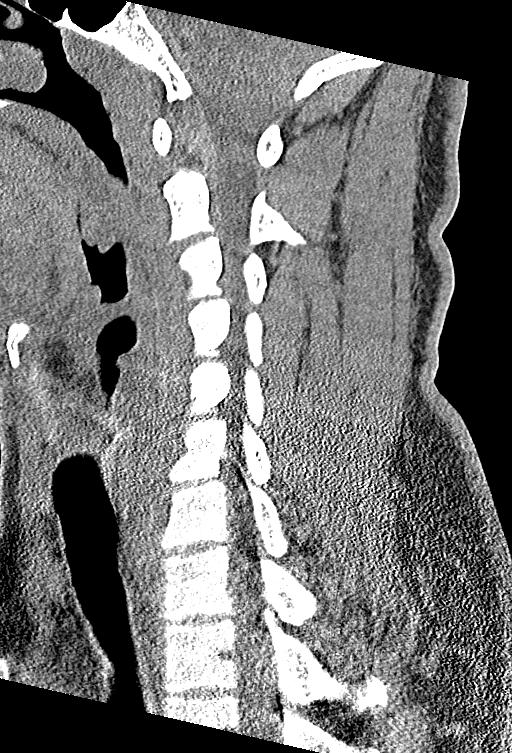
[im 31/61  bone]
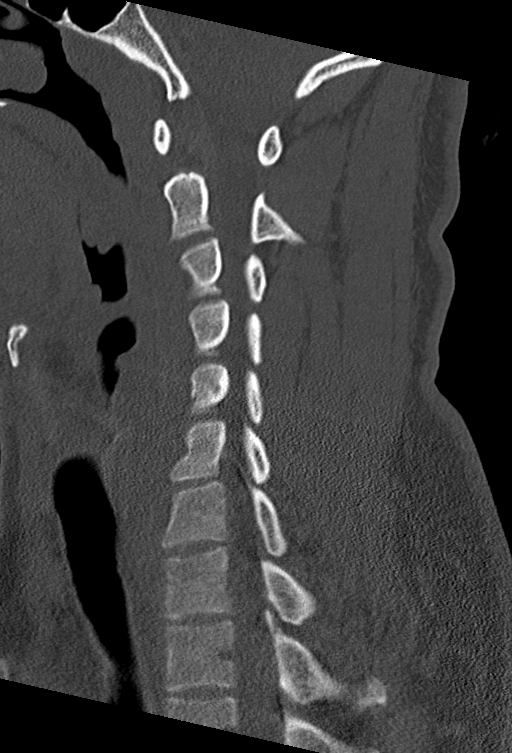
[im 36/61  bone]
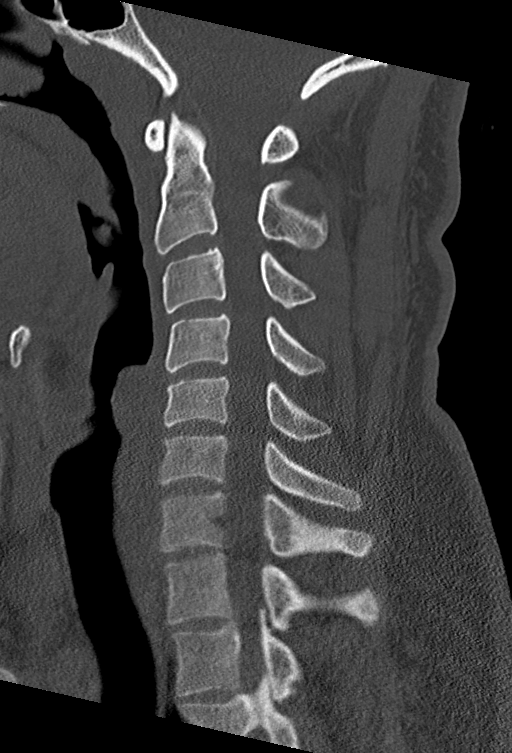
[im 41/61  bone]
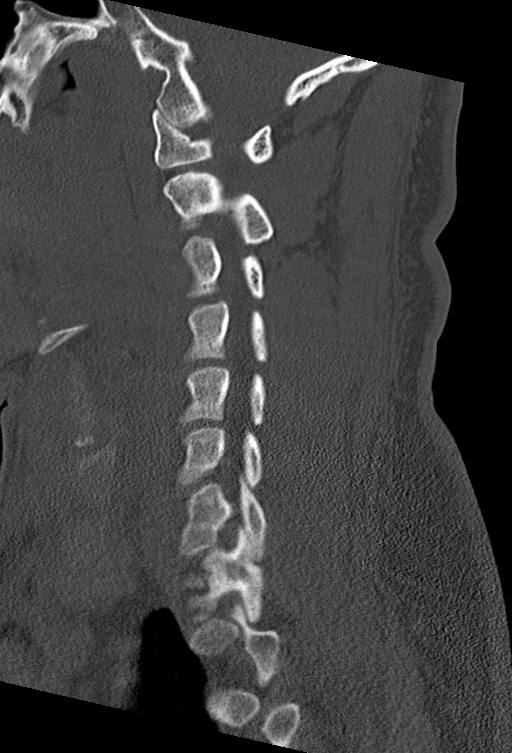

[13 of 33 positions shown; findings below may reference images not displayed]

FINDINGS: Alignment: Straightening/mild reversal of cervical lordosis. No
listhesis. Mild dextrocurvature which could be positional.

Skull base and vertebrae: Negative for fracture, bone lesion, or
erosion.

Soft tissues and spinal canal: Low cerebellar tonsils with degree of
foramen magnum crowding. Tonsillar descent is by 1 cm. No detected
syrinx.

Disc levels: No bony foraminal impingement to explain the history.
No gross herniation.

Upper chest: Negative
IMPRESSION: 1. No acute finding or bony foraminal impingement.
2. Cerebellar tonsils descend 1 cm below the foramen magnum,
probable Chiari 1 malformation.

## 2023-06-22 ENCOUNTER — Other Ambulatory Visit: Payer: Self-pay

## 2023-06-22 ENCOUNTER — Emergency Department (HOSPITAL_COMMUNITY)
Admission: EM | Admit: 2023-06-22 | Discharge: 2023-06-22 | Disposition: A | Discharge status: Home / Self Care | Attending: Emergency Medicine | Admitting: Emergency Medicine

## 2023-06-22 ENCOUNTER — Encounter (HOSPITAL_COMMUNITY): Payer: Self-pay | Admitting: Emergency Medicine

## 2023-06-22 DIAGNOSIS — R030 Elevated blood-pressure reading, without diagnosis of hypertension: Secondary | ICD-10-CM | POA: Insufficient documentation

## 2023-06-22 DIAGNOSIS — F17200 Nicotine dependence, unspecified, uncomplicated: Secondary | ICD-10-CM | POA: Insufficient documentation

## 2023-06-22 LAB — URINALYSIS, ROUTINE W REFLEX MICROSCOPIC
Bilirubin Urine: NEGATIVE
Glucose, UA: NEGATIVE mg/dL
Hgb urine dipstick: NEGATIVE
Ketones, ur: 5 mg/dL — AB
Leukocytes,Ua: NEGATIVE
Nitrite: NEGATIVE
Protein, ur: NEGATIVE mg/dL
Specific Gravity, Urine: 1.026 (ref 1.005–1.030)
pH: 5 (ref 5.0–8.0)

## 2023-06-22 LAB — CBC
HCT: 43.8 % (ref 39.0–52.0)
Hemoglobin: 13.6 g/dL (ref 13.0–17.0)
MCH: 23.8 pg — ABNORMAL LOW (ref 26.0–34.0)
MCHC: 31.1 g/dL (ref 30.0–36.0)
MCV: 76.6 fL — ABNORMAL LOW (ref 80.0–100.0)
Platelets: 368 10*3/uL (ref 150–400)
RBC: 5.72 MIL/uL (ref 4.22–5.81)
RDW: 14.9 % (ref 11.5–15.5)
WBC: 8.7 10*3/uL (ref 4.0–10.5)
nRBC: 0 % (ref 0.0–0.2)

## 2023-06-22 LAB — COMPREHENSIVE METABOLIC PANEL
ALT: 19 U/L (ref 0–44)
AST: 18 U/L (ref 15–41)
Albumin: 3.9 g/dL (ref 3.5–5.0)
Alkaline Phosphatase: 52 U/L (ref 38–126)
Anion gap: 9 (ref 5–15)
BUN: 11 mg/dL (ref 6–20)
CO2: 21 mmol/L — ABNORMAL LOW (ref 22–32)
Calcium: 8.7 mg/dL — ABNORMAL LOW (ref 8.9–10.3)
Chloride: 105 mmol/L (ref 98–111)
Creatinine, Ser: 0.71 mg/dL (ref 0.61–1.24)
GFR, Estimated: >60 mL/min (ref >60)
Glucose, Bld: 93 mg/dL (ref 70–99)
Potassium: 3.8 mmol/L (ref 3.5–5.1)
Sodium: 135 mmol/L (ref 135–145)
Total Bilirubin: 0.9 mg/dL (ref 0.0–1.2)
Total Protein: 7.3 g/dL (ref 6.5–8.1)

## 2023-06-22 MED ORDER — AMLODIPINE BESYLATE 5 MG PO TABS: 5.0000 mg | ORAL_TABLET | Freq: Every day | ORAL | 0 refills | Status: AC

## 2023-06-22 MED ORDER — AMLODIPINE BESYLATE 5 MG PO TABS
5.0000 mg | ORAL_TABLET | Freq: Once | ORAL | Status: AC
  Administered 2023-06-22: 5 mg via ORAL
  Filled 2023-06-22: qty 1

## 2023-06-22 NOTE — Discharge Instructions (Signed)
 Take the prescribed medication as directed. Follow-up with primary care doctor as soon as you can.  I have attached a list of some local offices, or you may find your own.  You can call [phone] on the back of your insurance card for list of practices in network. Return to the ED for new or worsening symptoms.

## 2023-06-22 NOTE — ED Triage Notes (Signed)
 Patient reports that he got a referral from his occupational health nurse at work yesterday for htn.  Patient reports that he was at work tonight and started feeling dizzy, and was advised to go to the ER.

## 2023-06-22 NOTE — ED Provider Notes (Signed)
 [place] EMERGENCY DEPARTMENT AT [facility] Provider Note   CSN: [id number] Arrival date & time: 06/22/23  7829     History  Chief Complaint  Patient presents with   Dizziness   Hypertension    Alex Vasquez is a 38 y.o. male.  The history is provided by the patient and medical records.  Dizziness Hypertension   38 year old male presenting to the ED with elevated blood pressure readings.  Patient states he had a visit with the occupational health nurse at work yesterday for routine screening.  He had elevated blood pressure readings.  He hide referral sent to help him establish with primary care doctor, has not yet heard back on this.  Was at work tonight and he began feeling lightheaded and somewhat dizzy.  He did not have any focal numbness or weakness.  No blurred vision.  No chest pain or shortness of breath.  States blood pressure was checked again at work and remained elevated so sent to ER for further evaluation.  He does admit he has had some headaches over the past couple of weeks, always seem to get better with rest.  He is under a great deal of stress, has been working 2 jobs for the past 3 months, averaging only 4 to 5 hours of sleep per night.  He is a heavy smoker.  Does have family history of hypertension as well as cardiac issues.  He has never been on blood pressure medication in the past but was told his numbers have been elevated previously.  Home Medications Prior to Admission medications   Medication Sig Start Date End Date Taking? Authorizing Provider  cyclobenzaprine (FLEXERIL) 10 MG tablet Take 1 tablet (10 mg total) by mouth 2 (two) times daily as needed for muscle spasms. 07/26/17   Garlon Hatchet, PA-C  ibuprofen (ADVIL,MOTRIN) 200 MG tablet Take 400 mg by mouth every 6 (six) hours as needed for mild pain or moderate pain.    [provider]  ibuprofen (ADVIL,MOTRIN) 600 MG tablet Take 1 tablet (600 mg total) by mouth every 6 (six)  hours as needed. Patient not taking: Reported on 07/01/2017 01/11/17   Derwood Kaplan, MD  methocarbamol (ROBAXIN) 500 MG tablet Take 2 tablets (1,000 mg total) by mouth 4 (four) times daily as needed for muscle spasms (muscle spasm/pain). 07/01/17   Samuel Jester, DO  methylPREDNISolone (MEDROL DOSEPAK) 4 MG TBPK tablet Take as written Patient not taking: Reported on 07/01/2017 05/11/16   Audry Pili, PA-C  naproxen (NAPROSYN) 250 MG tablet Take 1 tablet (250 mg total) by mouth 2 (two) times daily as needed for mild pain or moderate pain (take with food). 07/01/17   Samuel Jester, DO  predniSONE (DELTASONE) 20 MG tablet Take 40 mg by mouth daily for 3 days, then 20mg  by mouth daily for 3 days, then 10mg  daily for 3 days 07/26/17   Garlon Hatchet, PA-C  traMADol (ULTRAM) 50 MG tablet Take 1 tablet (50 mg total) by mouth every 6 (six) hours as needed. Patient not taking: Reported on 07/01/2017 05/04/16   Jaynie Crumble, PA-C      Allergies    Patient has no known allergies.    Review of Systems   Review of Systems  Neurological:  Positive for dizziness.  All other systems reviewed and are negative.   Physical Exam Updated Vital Signs BP (!) 163/124 (BP Location: Left Arm)   Pulse 97   Temp 98.1 F (36.7 C) (Oral)  Resp 17   Wt 110 kg   SpO2 94%   BMI 32.89 kg/m   Physical Exam Vitals and nursing note reviewed.  Constitutional:      Appearance: He is well-developed.  HENT:     Head: Normocephalic and atraumatic.  Eyes:     Conjunctiva/sclera: Conjunctivae normal.     Pupils: Pupils are equal, round, and reactive to light.  Cardiovascular:     Rate and Rhythm: Normal rate and regular rhythm.     Heart sounds: Normal heart sounds.  Pulmonary:     Effort: Pulmonary effort is normal.     Breath sounds: Normal breath sounds.  Abdominal:     General: Bowel sounds are normal.     Palpations: Abdomen is soft.  Musculoskeletal:        General: Normal range of motion.      Cervical back: Normal range of motion.  Skin:    General: Skin is warm and dry.  Neurological:     Mental Status: He is alert and oriented to person, place, and time.     Comments: AAOx3, answering questions and following commands appropriately; equal strength UE and LE bilaterally; CN grossly intact; moves all extremities appropriately without ataxia; no focal neuro deficits or facial asymmetry appreciated     ED Results / Procedures / Treatments   Labs (all labs ordered are listed, but only abnormal results are displayed) Labs Reviewed  CBC - Abnormal; Notable for the following components:      Result Value   MCV 76.6 (*)    MCH 23.8 (*)    All other components within normal limits  URINALYSIS, ROUTINE W REFLEX MICROSCOPIC - Abnormal; Notable for the following components:   Ketones, ur 5 (*)    All other components within normal limits  COMPREHENSIVE METABOLIC PANEL - Abnormal; Notable for the following components:   CO2 21 (*)    Calcium 8.7 (*)    All other components within normal limits    EKG None  Radiology No results found.  Procedures Procedures    Medications Ordered in ED Medications - No data to display  ED Course/ Medical Decision Making/ A&P                                 Medical Decision Making Amount and/or Complexity of Data Reviewed Labs: ordered. ECG/medicine tests: ordered and independent interpretation performed.  Risk Prescription drug management.   38 y.o. M here with elevated BP readings from work.  Hx of same, never on HTN medications though.  He is AAOx3, no focal deficits.  BP 160's/120's.  No chest pain or SOB.  No current headaches or dizziness.  EKG sinus rhythm.  Labs are grossly reassuring without leukocytosis or electrolyte derangement.  Normal renal function.  UA without proteinuria.  From talking with him, sounds like pressures have been elevated for quite some time but never initiated medications.  Does have family history  of hypertension.  He is also a daily smoker so does have some risk factors.  Will give dose of amlodipine and reassess.  5:47 AM BP has down trended some from time of arrival.  He remains awake and alert without focal neurologic deficits.  No evidence of endorgan damage.  Feel he is stable for discharge.  He tolerated amlodipine well so we will continue this.  He will need to arrange follow-up with PCP.  Can return here for  new concerns.  Final Clinical Impression(s) / ED Diagnoses Final diagnoses:  Elevated blood pressure reading    Rx / DC Orders ED Discharge Orders          Ordered    amLODipine (NORVASC) 5 MG tablet  Daily        06/22/23 0550              Garlon Hatchet, PA-C 06/22/23 1610    Glynn Octave, MD 06/22/23 [phone]

## 2024-02-04 ENCOUNTER — Encounter (HOSPITAL_COMMUNITY): Payer: Self-pay | Admitting: *Deleted

## 2024-02-04 ENCOUNTER — Emergency Department (HOSPITAL_COMMUNITY)
Admission: EM | Admit: 2024-02-04 | Discharge: 2024-02-05 | Disposition: A | Payer: Self-pay | Attending: Emergency Medicine | Admitting: Emergency Medicine

## 2024-02-04 ENCOUNTER — Other Ambulatory Visit: Payer: Self-pay

## 2024-02-04 DIAGNOSIS — K4091 Unilateral inguinal hernia, without obstruction or gangrene, recurrent: Secondary | ICD-10-CM | POA: Insufficient documentation

## 2024-02-04 DIAGNOSIS — F17290 Nicotine dependence, other tobacco product, uncomplicated: Secondary | ICD-10-CM | POA: Insufficient documentation

## 2024-02-04 LAB — URINALYSIS, ROUTINE W REFLEX MICROSCOPIC
Bacteria, UA: NONE SEEN
Bilirubin Urine: NEGATIVE
Glucose, UA: NEGATIVE mg/dL
Ketones, ur: NEGATIVE mg/dL
Leukocytes,Ua: NEGATIVE
Nitrite: NEGATIVE
Protein, ur: NEGATIVE mg/dL
Specific Gravity, Urine: 1.028 (ref 1.005–1.030)
pH: 5 (ref 5.0–8.0)

## 2024-02-04 LAB — COMPREHENSIVE METABOLIC PANEL WITH GFR
ALT: 20 U/L (ref 0–44)
AST: 21 U/L (ref 15–41)
Albumin: 3.4 g/dL — ABNORMAL LOW (ref 3.5–5.0)
Alkaline Phosphatase: 48 U/L (ref 38–126)
Anion gap: 11 (ref 5–15)
BUN: 11 mg/dL (ref 6–20)
CO2: 23 mmol/L (ref 22–32)
Calcium: 8.8 mg/dL — ABNORMAL LOW (ref 8.9–10.3)
Chloride: 103 mmol/L (ref 98–111)
Creatinine, Ser: 0.99 mg/dL (ref 0.61–1.24)
GFR, Estimated: >60 mL/min (ref >60)
Glucose, Bld: 104 mg/dL — ABNORMAL HIGH (ref 70–99)
Potassium: 4.2 mmol/L (ref 3.5–5.1)
Sodium: 137 mmol/L (ref 135–145)
Total Bilirubin: 0.9 mg/dL (ref 0.0–1.2)
Total Protein: 6.4 g/dL — ABNORMAL LOW (ref 6.5–8.1)

## 2024-02-04 LAB — CBC
HCT: 41.6 % (ref 39.0–52.0)
Hemoglobin: 13.5 g/dL (ref 13.0–17.0)
MCH: 24 pg — ABNORMAL LOW (ref 26.0–34.0)
MCHC: 32.5 g/dL (ref 30.0–36.0)
MCV: 73.9 fL — ABNORMAL LOW (ref 80.0–100.0)
Platelets: 356 K/uL (ref 150–400)
RBC: 5.63 MIL/uL (ref 4.22–5.81)
RDW: 15.3 % (ref 11.5–15.5)
WBC: 9.4 K/uL (ref 4.0–10.5)
nRBC: 0 % (ref 0.0–0.2)

## 2024-02-04 LAB — LIPASE, BLOOD: Lipase: 27 U/L (ref 11–51)

## 2024-02-04 NOTE — ED Triage Notes (Signed)
 Pt with left inguinal swelling and pain.  Hx of hernia repair in that same area in 2017.

## 2024-02-05 ENCOUNTER — Emergency Department (HOSPITAL_COMMUNITY): Payer: Self-pay

## 2024-02-05 MED ORDER — NAPROXEN 500 MG PO TABS: 500.0000 mg | ORAL_TABLET | Freq: Two times a day (BID) | ORAL | 0 refills | Status: AC

## 2024-02-05 MED ORDER — IOHEXOL 350 MG/ML SOLN
75.0000 mL | Freq: Once | INTRAVENOUS | Status: AC | PRN
  Administered 2024-02-05: 75 mL via INTRAVENOUS

## 2024-02-05 NOTE — Discharge Instructions (Signed)
 You were evaluated in the Emergency Department and after careful evaluation, we did not find any emergent condition requiring admission or further testing in the hospital.  Your exam/testing today is overall reassuring.  Symptoms seem to be due to a fat-containing hernia.  Recommend follow-up with your surgeon as we discussed.  Use the Naprosyn  twice daily as needed for pain.  Please return to the Emergency Department if you experience any worsening of your condition.   Thank you for allowing us  to be a part of your care.

## 2024-02-05 NOTE — ED Provider Notes (Signed)
 [facility]-EMERGENCY DEPT [facility] Emergency Department Provider Note MRN:  [id number]  Arrival date & time: 02/05/24     Chief Complaint   Abdominal Pain   History of Present Illness   Alex Vasquez is a 38 y.o. year-old male with a history of inguinal hernia presenting to the ED with chief complaint of abdominal pain.  Patient is concerned that his hernia is returning.  Had mesh repair of a left inguinal hernia years ago and now it is bulging again.  Pain is worse than last time.  Not having any nausea or vomiting, normal stool.  No fever.  Review of Systems  A thorough review of systems was obtained and all systems are negative except as noted in the HPI and PMH.   Patient's Health History    Past Medical History:  Diagnosis Date   Cervical radiculopathy    Chronic neck pain    Hernia    History of herniated intervertebral disc     Past Surgical History:  Procedure Laterality Date   HERNIA REPAIR     KNEE SURGERY      Family History  Problem Relation Age of Onset   Hypertension Father    Cancer Paternal Aunt        unknown to pt   Diabetes Paternal Aunt     Social History   Socioeconomic History   Marital status: Single    Spouse name: Not on file   Number of children: Not on file   Years of education: Not on file   Highest education level: Not on file  Occupational History   Not on file  Tobacco Use   Smoking status: Some Days    Types: Cigars   Smokeless tobacco: Never  Substance and Sexual Activity   Alcohol use: Yes    Comment: rarely   Drug use: No   Sexual activity: Not on file  Other Topics Concern   Not on file  Social History Narrative   Not on file   Social Drivers of Health   Financial Resource Strain: Not on file  Food Insecurity: Not on file  Transportation Needs: Not on file  Physical Activity: Not on file  Stress: Not on file  Social Connections: Not on file  Intimate Partner Violence: Not on file     Physical Exam    Vitals:   02/04/24 2117 02/05/24 0118  BP: (!) 156/108 (!) 164/119  Pulse: 81 78  Resp: 16 16  Temp: 98.2 F (36.8 C) 98 F (36.7 C)  SpO2: 100% 100%    CONSTITUTIONAL: Well-appearing, NAD NEURO/PSYCH:  Alert and oriented x 3, no focal deficits EYES:  eyes equal and reactive ENT/NECK:  no LAD, no JVD CARDIO: Regular rate, well-perfused, normal S1 and S2 PULM:  CTAB no wheezing or rhonchi GI/GU:  non-distended, non-tender MSK/SPINE:  No gross deformities, no edema SKIN:  no rash, atraumatic   *Additional and/or pertinent findings included in MDM below  Diagnostic and Interventional Summary    EKG Interpretation Date/Time:    Ventricular Rate:    PR Interval:    QRS Duration:    QT Interval:    QTC Calculation:   R Axis:      Text Interpretation:         Labs Reviewed  COMPREHENSIVE METABOLIC PANEL WITH GFR - Abnormal; Notable for the following components:      Result Value   Glucose, Bld 104 (*)    Calcium 8.8 (*)    Total  Protein 6.4 (*)    Albumin 3.4 (*)    All other components within normal limits  CBC - Abnormal; Notable for the following components:   MCV 73.9 (*)    MCH 24.0 (*)    All other components within normal limits  URINALYSIS, ROUTINE W REFLEX MICROSCOPIC - Abnormal; Notable for the following components:   Hgb urine dipstick SMALL (*)    All other components within normal limits  LIPASE, BLOOD    CT ABDOMEN PELVIS W CONTRAST  Final Result      Medications  iohexol (OMNIPAQUE) 350 MG/ML injection 75 mL (75 mLs Intravenous Contrast Given 02/05/24 0236)     Procedures  /  Critical Care Procedures  ED Course and Medical Decision Making  Initial Impression and Ddx Fullness and mild tenderness to the left inguinal region suspicious for hernia.  A bit that he would have recurrence of hernia despite mesh repair in the past.  Obtaining CT.  No symptoms of obstruction, no fever, no skin changes overlying this area.  Past medical/surgical  history that increases complexity of ED encounter: Hernia  Interpretation of Diagnostics I personally reviewed the Laboratory Testing and my interpretation is as follows: No significant blood count or electrolyte disturbance.  CT reveals fat-containing hernia, no other concerns.  Patient Reassessment and Ultimate Disposition/Management     Patient doing well on reassessment, appropriate for discharge with follow-up.  Patient management required discussion with the following services or consulting groups:  None  Complexity of Problems Addressed Acute illness or injury that poses threat of life of bodily function  Additional Data Reviewed and Analyzed Further history obtained from: Prior labs/imaging results  Additional Factors Impacting ED Encounter Risk Prescriptions  Alex Dockendorf. [place], MD [facility] Emergency Medicine [facility] [email or web address]  Final Clinical Impressions(s) / ED Diagnoses     ICD-10-CM   1. Unilateral recurrent inguinal hernia without obstruction or gangrene  K40.91       ED Discharge Orders          Ordered    naproxen  (NAPROSYN ) 500 MG tablet  2 times daily        02/05/24 0407             Discharge Instructions Discussed with and Provided to Patient:     Discharge Instructions      You were evaluated in the Emergency Department and after careful evaluation, we did not find any emergent condition requiring admission or further testing in the hospital.  Your exam/testing today is overall reassuring.  Symptoms seem to be due to a fat-containing hernia.  Recommend follow-up with your surgeon as we discussed.  Use the Naprosyn  twice daily as needed for pain.  Please return to the Emergency Department if you experience any worsening of your condition.   Thank you for allowing us  to be a part of your care.       Vasquez Alex HERO, MD 02/05/24 [phone]
